# Patient Record
Sex: Female | Born: 1960 | Race: White | Hispanic: No | Marital: Single | Smoking: Current every day smoker
Health system: Southern US, Community
[De-identification: ages and names within clinical notes are randomized; demographics above are authoritative.]

## PROBLEM LIST (undated history)

## (undated) DIAGNOSIS — I10 Essential (primary) hypertension: Secondary | ICD-10-CM

## (undated) HISTORY — DX: Essential (primary) hypertension: I10

## (undated) HISTORY — PX: APPENDECTOMY: SHX54

---

## 2001-02-05 ENCOUNTER — Encounter: Admission: RE | Admit: 2001-02-05 | Discharge: 2001-02-05 | Payer: Self-pay | Admitting: Obstetrics and Gynecology

## 2001-02-05 ENCOUNTER — Encounter: Payer: Self-pay | Admitting: Obstetrics and Gynecology

## 2001-02-08 ENCOUNTER — Encounter: Admission: RE | Admit: 2001-02-08 | Discharge: 2001-02-08 | Payer: Self-pay | Admitting: Obstetrics and Gynecology

## 2001-02-08 ENCOUNTER — Encounter: Payer: Self-pay | Admitting: Obstetrics and Gynecology

## 2001-02-19 ENCOUNTER — Other Ambulatory Visit: Admission: RE | Admit: 2001-02-19 | Discharge: 2001-02-19 | Payer: Self-pay | Admitting: Obstetrics and Gynecology

## 2010-02-05 ENCOUNTER — Encounter: Admission: RE | Admit: 2010-02-05 | Discharge: 2010-02-05 | Payer: Self-pay | Admitting: Family Medicine

## 2010-08-05 ENCOUNTER — Encounter
Admission: RE | Admit: 2010-08-05 | Discharge: 2010-08-05 | Payer: Self-pay | Source: Home / Self Care | Attending: Family Medicine | Admitting: Family Medicine

## 2010-08-06 ENCOUNTER — Encounter
Admission: RE | Admit: 2010-08-06 | Discharge: 2010-08-06 | Payer: Self-pay | Source: Home / Self Care | Attending: Family Medicine | Admitting: Family Medicine

## 2010-08-18 ENCOUNTER — Encounter: Payer: Self-pay | Admitting: Family Medicine

## 2011-02-18 ENCOUNTER — Other Ambulatory Visit (INDEPENDENT_AMBULATORY_CARE_PROVIDER_SITE_OTHER): Payer: Self-pay | Admitting: General Surgery

## 2011-02-18 ENCOUNTER — Encounter (HOSPITAL_COMMUNITY): Payer: Self-pay

## 2011-02-18 ENCOUNTER — Other Ambulatory Visit: Payer: Self-pay | Admitting: Family Medicine

## 2011-02-18 ENCOUNTER — Ambulatory Visit (HOSPITAL_COMMUNITY)
Admission: RE | Admit: 2011-02-18 | Discharge: 2011-02-18 | Disposition: A | Discharge status: Home / Self Care | Payer: 59 | Source: Ambulatory Visit | Attending: Family Medicine | Admitting: Family Medicine

## 2011-02-18 ENCOUNTER — Inpatient Hospital Stay (HOSPITAL_COMMUNITY)
Admission: EM | Admit: 2011-02-18 | Discharge: 2011-02-19 | DRG: 343 | Disposition: A | Discharge status: Home / Self Care | Payer: 59 | Attending: General Surgery | Admitting: General Surgery

## 2011-02-18 DIAGNOSIS — R1031 Right lower quadrant pain: Secondary | ICD-10-CM

## 2011-02-18 DIAGNOSIS — K219 Gastro-esophageal reflux disease without esophagitis: Secondary | ICD-10-CM | POA: Diagnosis present

## 2011-02-18 DIAGNOSIS — K3580 Unspecified acute appendicitis: Secondary | ICD-10-CM

## 2011-02-18 DIAGNOSIS — I10 Essential (primary) hypertension: Secondary | ICD-10-CM | POA: Diagnosis present

## 2011-02-18 DIAGNOSIS — K389 Disease of appendix, unspecified: Secondary | ICD-10-CM | POA: Insufficient documentation

## 2011-02-18 LAB — URINE MICROSCOPIC-ADD ON

## 2011-02-18 LAB — DIFFERENTIAL
Basophils Absolute: 0 10*3/uL (ref 0.0–0.1)
Eosinophils Relative: 0 % (ref 0–5)
Lymphocytes Relative: 13 % (ref 12–46)
Lymphs Abs: 2.6 10*3/uL (ref 0.7–4.0)
Neutrophils Relative %: 77 % (ref 43–77)

## 2011-02-18 LAB — URINALYSIS, ROUTINE W REFLEX MICROSCOPIC
Bilirubin Urine: NEGATIVE
Glucose, UA: NEGATIVE mg/dL
Specific Gravity, Urine: >1.046 — ABNORMAL HIGH (ref 1.005–1.030)
Urobilinogen, UA: 0.2 mg/dL (ref 0.0–1.0)
pH: 5 (ref 5.0–8.0)

## 2011-02-18 LAB — PROTIME-INR: INR: 1.01 (ref 0.00–1.49)

## 2011-02-18 LAB — CBC
HCT: 45.6 % (ref 36.0–46.0)
Platelets: 278 10*3/uL (ref 150–400)
RBC: 4.69 MIL/uL (ref 3.87–5.11)
RDW: 12 % (ref 11.5–15.5)
WBC: 19.6 10*3/uL — ABNORMAL HIGH (ref 4.0–10.5)

## 2011-02-18 LAB — BASIC METABOLIC PANEL
Calcium: 9.5 mg/dL (ref 8.4–10.5)
Chloride: 94 mEq/L — ABNORMAL LOW (ref 96–112)
Creatinine, Ser: 0.77 mg/dL (ref 0.50–1.10)
GFR calc Af Amer: >60 mL/min (ref >60)
Sodium: 130 mEq/L — ABNORMAL LOW (ref 135–145)

## 2011-02-18 LAB — APTT: aPTT: 31 seconds (ref 24–37)

## 2011-02-18 MED ORDER — IOHEXOL 300 MG/ML  SOLN
100.0000 mL | Freq: Once | INTRAMUSCULAR | Status: AC | PRN
  Administered 2011-02-18: 100 mL via INTRAVENOUS

## 2011-02-19 DIAGNOSIS — M79609 Pain in unspecified limb: Secondary | ICD-10-CM

## 2011-02-21 ENCOUNTER — Ambulatory Visit (INDEPENDENT_AMBULATORY_CARE_PROVIDER_SITE_OTHER): Payer: 59 | Admitting: General Surgery

## 2011-02-21 ENCOUNTER — Encounter (INDEPENDENT_AMBULATORY_CARE_PROVIDER_SITE_OTHER): Payer: Self-pay | Admitting: General Surgery

## 2011-02-21 VITALS — BP 123/79 | HR 66 | Temp 97.8°F

## 2011-02-21 DIAGNOSIS — Z4889 Encounter for other specified surgical aftercare: Secondary | ICD-10-CM

## 2011-02-21 DIAGNOSIS — Z5189 Encounter for other specified aftercare: Secondary | ICD-10-CM

## 2011-02-21 MED ORDER — AMOXICILLIN-POT CLAVULANATE 875-125 MG PO TABS: 1.0000 | ORAL_TABLET | Freq: Two times a day (BID) | ORAL | Status: AC

## 2011-02-21 NOTE — Progress Notes (Signed)
Subjective:     Patient ID: [name], female   DOB: 02-05-1961, 50 y.o.   MRN: [id number]  HPI Pod #3 from lap appendectomy for acute appendicitis.  Doing well and without complaint other than some redness around her umbilical wound.  Her abdominal pain is much improved from prior to surgery and she is tolerating diet.  Bowels functional but constipated, relieved with dulcolax.  She denies fevers, chills or drainage from the wound.  Review of Systems     Objective:   Physical Exam Vitals normal. AF Abdomen is soft, with minimal incisional tenderness, no drainage, but she does have some blanching erythema inferior to the umbilicus, no fluctuance, the other incisions appear to be without infection, but she does have bruising around her other trocar sites as well.     Assessment:     S/p lap appendectomy, doing well but she does appear to have some cellulitis around the wound.    Plan:     Will try some augmentin for this. I do not see any sign of abscess.  Otherwise she is doing well. I recommended that she call us back if the redness increases, becomes more tender, fevers, or chills, or drainage.  She will follow up with me next week or sooner prn.

## 2011-02-28 ENCOUNTER — Ambulatory Visit (INDEPENDENT_AMBULATORY_CARE_PROVIDER_SITE_OTHER): Payer: 59 | Admitting: General Surgery

## 2011-02-28 ENCOUNTER — Encounter (INDEPENDENT_AMBULATORY_CARE_PROVIDER_SITE_OTHER): Payer: Self-pay | Admitting: General Surgery

## 2011-02-28 VITALS — BP 118/84 | Temp 97.8°F

## 2011-02-28 DIAGNOSIS — Z5189 Encounter for other specified aftercare: Secondary | ICD-10-CM

## 2011-02-28 DIAGNOSIS — Z4889 Encounter for other specified surgical aftercare: Secondary | ICD-10-CM

## 2011-02-28 NOTE — Progress Notes (Signed)
Subjective:     Patient ID: [name], female   DOB: 10/07/1960, 50 y.o.   MRN: [id number]  HPI She is 10 days status post arthroscopic appendectomy and follows up today for evaluation of her umbilical wound. At her last visit she had some erythema around the wound and she was started on Augmentin which she has taken now for one week. She denies any fevers or pain she states that her redness is improved. She is tolerating regular diet and functioning well without any complaints. She is asking her to go back to work.  Review of Systems     Objective:   Physical Exam She is in no acute distress and nontoxic-appearing  Her abdomen is soft and nontender to exam her incisions are well-healed without sign of infection the erythema around her umbilicus is now completely resolved. There is no evidence of bruising, infection, erythema, or tenderness.    Assessment:     S/p lap appendectomy    Plan:     She is doing very well now and the erythema has resolved completely. The pathology was benign and she has no complaints. She can return to work as tolerated although I recommended that she continue light-duty and minimize lifting for another 2 weeks. She can followup with me on a p.r.n. basis.

## 2011-03-08 NOTE — Op Note (Signed)
NAMEBRIANNA, [name]           ACCOUNT NO.:  [id number]  MEDICAL RECORD NO.:  [id number]  LOCATION:  [facility]                         FACILITY:  [facility]  PHYSICIAN:  Lodema Pilot, MD       DATE OF BIRTH:  04/01/1961  DATE OF PROCEDURE:  02/18/2011 DATE OF DISCHARGE:                              OPERATIVE REPORT   PROCEDURE PERFORMED:  Diagnostic laparoscopy with laparoscopic appendectomy.  PREOPERATIVE DIAGNOSIS:  Acute appendicitis.  POSTOPERATIVE DIAGNOSIS:  Acute appendicitis.  SURGEON:  Lodema Pilot, M.D.  ASSISTANT:  None.  ANESTHESIA:  General endotracheal anesthesia with 30 mL of 1% lidocaine with epinephrine and 0.25% Marcaine injected in a 50:50 mixture.  FLUIDS:  700 mL of crystalloid.  ESTIMATED BLOOD LOSS:  Minimal.  URINE OUTPUT:  200 mL.  DRAINS:  None.  SPECIMENS:  Appendix sent to pathology for permanent sectioning.  COMPLICATIONS:  None apparent.  FINDINGS:  Congenital abscess of the uterus, acute suppurative appendicitis with a normal-appearing base of the appendix.  No other acute intra-abdominal findings were identified.  INDICATION FOR PROCEDURE:  [name] is a 50 year old female, who began having generalized abdominal pain beginning at 9 o'clock this morning.  The pain suddenly progressed and was seen in the urgent care. She had a white count of 18,000 and was sent for CT scan of the abdomen, which demonstrated early acute appendicitis.  She had a physical exam consistent with acute appendicitis and desires surgical treatment.  OPERATIVE DETAILS:  [name] was seen and evaluated in the preoperative area and risks and benefits of the procedure were discussed in lay terms.  Informed consent was obtained.  She was taken to the operating room, placed on the table in the supine position and general endotracheal anesthesia was obtained.  Prophylactic antibiotics were given and Foley catheter was placed.  Her left arm was tucked.   Her abdomen was prepped and draped in a standard surgical fashion.  Her previous infraumbilical incision was used to access the abdomen.  The fascia was elevated between Kocher clamps and then sharply incised and the peritoneum was accessed under direct visualization. Pneumoperitoneum was obtained.  A 5 mm laparoscope scope was placed into the abdomen.  Additional left lower quadrant trocar was placed under direct visualization and the umbilical trocar was exchanged for a 12 mm balloon trocar.  A suprapubic 5 mm trocar was placed under direct visualization taking care to avoid injury to the bladder and the abdomen was explored.  There was no obvious purulence or free fluid upon initial exploration of the abdomen.  The terminal ileum was identified and the right colon and cecum were identified and immediately inferomedial to the cecum was located the appendix and she had obvious acute appendicitis with fibrinous exudate overlying injected and swollen appendix.  The appendix was easily elevated and was not too adherent to the surrounding structures.  Proximally, three-fourth of the way towards the base of the appendix was a clear transition point of acute appendicitis.  The base of the appendix was healthy-appearing.  A window was created through the mesoappendix and the appendix was divided at its base with an Endo-GIA blue load stapler using the 3.5 mm staple.  The staple  line appeared well-formed and was hemostatic.  The appendix was elevated and the mesoappendix was divided with an Endo GIA white load 2.5 mm staple firing.  The appendix was hemostatic and the staples were well-formed.  There was no evidence of bleeding.  The appendix was removed from the umbilical trocar site in an EndoCatch bag and passed off the table and send to pathology for permanent section.  The 12 mm trocar was replaced and the abdomen explored.  There was no evidence of purulent material in the appendix and  there was no evidence of bleeding. Staple lines appeared intact.  I ran the small bowel from the ileocecal valve proximally for several feet, which did not demonstrate any abnormalities such as Meckel's diverticulum.  No evidence of inflammatory bowel disease.  There was no uterus identified consistent with previous history of congenital absence of the uterus.  The right ovary was identified and pictures were taken.  The fallopian tube appeared cystic in nature.  The left ovary and tube appeared fairly normal.  The left lower quadrant and umbilical trocars were removed under direct visualization even the suprapubic trocar in place and the umbilical fascia was approximated with interrupted 0 Vicryl sutures. The sutures were secured and the abdomen re-insufflated with carbon dioxide gas and through the suprapubic trocar site, I explored the abdomen again and there was no evidence of bowel contents.  Hemostasis was adequate and no evidence of bowel injury upon closure.  The camera was removed.  The suprapubic trocar was removed.  The skin was anesthetized with a total 30 mL of 1% lidocaine with epinephrine and 0.25% Marcaine solution in a 50:50 mixture and skin was washed and dried and skin edges were approximated with 4-0 Monocryl subcuticular suture. The Dermabond was applied and all sponge and instrument count were correct at the end of the case and the patient tolerated the procedure well without any complications.  Foley catheter was removed at the end of the case and she was stabilized and transferred to the recovery room in stable condition.          ______________________________ Lodema Pilot, MD     BL/MEDQ  D:  02/18/2011  T:  02/19/2011  Job:  [id number]  Electronically Signed by Lodema Pilot DO on 03/08/2011 05:58:45 PM

## 2011-03-08 NOTE — Consult Note (Addendum)
[name], [name]           ACCOUNT NO.:  [id number]  MEDICAL RECORD NO.:  [id number]  LOCATION:                               FACILITY:  [facility]  PHYSICIAN:  Lodema Pilot, MD       DATE OF BIRTH:  1961/07/20  DATE OF CONSULTATION:  02/18/2011 DATE OF DISCHARGE:                                CONSULTATION   CONSULTING PROVIDER:  Eber Hong, MD  Consulting for abdominal pain and rule out appendicitis.  HISTORY OF PRESENT ILLNESS:  [name] is a 50-year female who has a history of occasional abdominal pain, which she describes as diffuse and intermittent.  She states that she was not feeling fine yesterday, but awoke this morning feeling fine as well but at approximately 9 o'clock this morning at work, began having diffuse abdominal pain, which she describes as "feels like someone punching me in the stomach."  It is not localized at this time, but has been continuous since this morning. She felt that this was gas pains and thought it would help if she ate some lunch.  She did eat some lunch and had increasing abdominal pain. [facility] of the abdomen, which demonstrated swelling of the tip of the appendix and concerns for early acute appendicitis and she was sent to [facility] for further evaluation.  Since her CT scan, she has had nonbloody emesis.  She denies any fevers or chills.  She also denies any hematochezia or melena.  She denies any hematuria or dysuria.  She states that her bowels are normal and last had a bowel movement this morning without any change in her symptoms.  ALLERGIES:  None.  MEDICATIONS:  Lisinopril 20 mg daily, Prilosec, Creon.  PAST HISTORY:  Hypertension, gastroesophageal reflux.  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  SOCIAL HISTORY:   She smokes 1 pack per day for the last 15 years.  She admits to being heavy drinker with 2 to 6 drinks per day, "if available."  FAMILY HISTORY:  She has a family history of an aunt with breast cancer, but otherwise denies any significant family history.  REVIEW OF SYMPTOMS:  Negative except for as in the history of present illness.  PHYSICAL EXAMINATION:  GENERAL:  She is in no acute stress and nontoxic appearing. VITAL SIGNS:  She is satting at 100% on room air.  Blood pressure is 139/92, heart rate is 116. HEENT:  Head is normocephalic and atraumatic.  Sclera is white.  Pupils are equal, round, and reactive to light. NECK:  Her neck demonstrates midline trachea.  No stridor. LUNGS:  Her lungs are clear to auscultation bilaterally without wheezes or rales. HEART:  Her heart rate is fast with heart rate in the one-teens, however, there is regular rhythm.  Pulses are palpable in all 4 extremities. ABDOMEN:  Soft.  Moderate tenderness in the right lower quadrant and suprapubic region.  She describes diffuse abdominal pain, however, tenderness seems to be focal in the right lower quadrant.  Abdomen is nondistended and no evidence of hernias. SKIN:  Warm and dry.  There is no evidence of rashes or erythema.  LABORATORY DATA:  Laboratory studies from the [facility] demonstrate a white count of 18.4, hemoglobin 16, hematocrit 49.0, platelets of 325.  She does have a left shift with 82% granulocytes. She has had a UA, which was normal except for a moderate amount of blood.  RADIOGRAPHIC STUDIES:  CT scan with findings of early acute appendicitis.  ASSESSMENT:  Abdominal pain, localized in the right lower quadrant, most likely early acute appendicitis.  RECOMMENDATIONS:  I discussed with the patient the options for overnight admission and serial abdominal exams with potential appendectomy if her symptoms worsen or do not improve versus early surgical  intervention to diagnostic laparoscopy and appendectomy.  We discussed the benefits and risks of each of these options and she would like to undergo early surgical intervention.  We discussed the risks of procedure including infection, bleeding, persistent pain, negative laparoscopy, abscess, need for future surgery, bowel injury and she expressed understanding and desires to proceed with diagnostic laparoscopy and appendectomy.  We will get an IV and administer some fluids for hydration, IV Invanz, and write for OR as soon as available.          ______________________________ Lodema Pilot, MD     BL/MEDQ  D:  02/18/2011  T:  02/18/2011  Job:  [id number]  Electronically Signed by Lodema Pilot DO on 03/18/2011 09:32:19 AM

## 2011-03-11 ENCOUNTER — Encounter (INDEPENDENT_AMBULATORY_CARE_PROVIDER_SITE_OTHER): Payer: 59

## 2011-05-07 ENCOUNTER — Other Ambulatory Visit: Payer: Self-pay | Admitting: Family Medicine

## 2011-05-07 DIAGNOSIS — Z1231 Encounter for screening mammogram for malignant neoplasm of breast: Secondary | ICD-10-CM

## 2011-09-15 ENCOUNTER — Ambulatory Visit: Payer: Self-pay | Admitting: Physician Assistant

## 2011-09-15 VITALS — BP 123/85 | HR 101 | Temp 98.2°F | Resp 16 | Ht 64.0 in | Wt 184.4 lb

## 2011-09-15 DIAGNOSIS — J019 Acute sinusitis, unspecified: Secondary | ICD-10-CM

## 2011-09-15 DIAGNOSIS — R05 Cough: Secondary | ICD-10-CM

## 2011-09-15 MED ORDER — CEFDINIR 300 MG PO CAPS: 300.0000 mg | ORAL_CAPSULE | Freq: Two times a day (BID) | ORAL | Status: AC

## 2011-09-15 MED ORDER — BENZONATATE 100 MG PO CAPS: 100.0000 mg | ORAL_CAPSULE | Freq: Three times a day (TID) | ORAL | Status: AC | PRN

## 2011-09-15 MED ORDER — IPRATROPIUM BROMIDE 0.06 % NA SOLN: 2.0000 | Freq: Two times a day (BID) | NASAL | Status: DC

## 2011-09-15 NOTE — Progress Notes (Signed)
Patient ID: [name] MRN: [id number], DOB: 02/15/1961, 51 y.o. Date of Encounter: 09/15/2011, 9:25 AM  Primary Physician: Eartha Inch, MD, MD  Chief Complaint:  Chief Complaint  Patient presents with  . Sinusitis    6 days    HPI: 51 y.o. year old female presents with 6 day history of worsening nasal congestion, sinus pressure, post nasal drip, and cough. Afebrile through out. Cough is mildly productive of green sputum, worse at night. Nasal congestion thick and green. Hearing normal. Sinus pressure worst along right maxillary sinus causing upper teeth pain and right otalgia. Normal appetite. No GI symptoms. Has tried Nyquil/Dayquil without success. Sick contacts at work. Continues to smoke, not interested in quitting.  No leg trauma, sedentary periods, h/o cancer.  Has appt with PCP next week for routine follow up.  No past medical history on file.   Home Meds: Prior to Admission medications   Medication Sig Start Date End Date Taking? Authorizing Provider  lisinopril-hydrochlorothiazide (PRINZIDE,ZESTORETIC) 20-12.5 MG per tablet Daily. 02/20/11  Yes Historical Provider, MD  Omega-3 Fatty Acids (OMEGA-3 FISH OIL PO) Take by mouth daily.     Yes Historical Provider, MD  omeprazole (PRILOSEC) 20 MG capsule Daily. 01/18/11  Yes Historical Provider, MD  Loratadine (CLARITIN PO) Take by mouth daily.      Historical Provider, MD    Allergies: No Known Allergies  History   Social History  . Marital Status: Single    Spouse Name: N/A    Number of Children: N/A  . Years of Education: N/A   Occupational History  . Not on file.   Social History Main Topics  . Smoking status: Current Everyday Smoker -- 1.0 packs/day    Types: Cigarettes  . Smokeless tobacco: Current User  . Alcohol Use: Yes  . Drug Use: No  . Sexually Active:    Other Topics Concern  . Not on file   Social History Narrative  . No narrative on file     Review of  Systems: Constitutional: negative for chills, fever, night sweats or weight changes Cardiovascular: negative for chest pain or palpitations Respiratory: negative for hemoptysis, wheezing, or shortness of breath Abdominal: negative for abdominal pain, nausea, vomiting or diarrhea Dermatological: negative for rash Neurologic: negative for headache   Physical Exam: Blood pressure 123/85, pulse 101, temperature 98.2 F (36.8 C), temperature source Oral, resp. rate 16, height 5\' 4"  (1.626 m), weight 184 lb 6.4 oz (83.643 kg), SpO2 97.00%., Body mass index is 31.65 kg/(m^2). General: Well developed, well nourished, in no acute distress. Head: Normocephalic, atraumatic, eyes without discharge, sclera non-icteric, nares are congested. Bilateral auditory canals clear, TM's are without perforation, left TM pearly grey with reflective cone of light. Right TM erythematous and dull. Right maxillary sinus TTP.  Oral cavity moist, dentition normal without TTP. Posterior pharynx with post nasal drip and mild erythema. No peritonsillar abscess or tonsillar exudate. Neck: Supple. No thyromegaly. Full ROM. No lymphadenopathy. Lungs: Clear bilaterally to auscultation without wheezes, rales, or rhonchi. Breathing is unlabored. Heart: RRR with S1 S2. No murmurs, rubs, or gallops appreciated. Msk:  Strength and tone normal for age. Extremities: No clubbing or cyanosis. No edema. Neuro: Alert and oriented X 3. Moves all extremities spontaneously. CNII-XII grossly in tact. Psych:  Responds to questions appropriately with a normal affect.     ASSESSMENT AND PLAN:  51 y.o. year old female with sinusitis. -Omnicef 300 mg 1 po bid #20 no RF  -Atrovent NS 0.06%  2 sprays each nare bid prn #1 no RF -Tessalon Perles 100 mg 1 po tid prn cough #30 no RF  -Mucinex -Tylenol/Motrin prn -Rest/fluids -RTC precautions -RTC 3-5 days if no improvement  Signed, Eula Listen, PA-C 09/15/2011 9:25 AM

## 2011-09-15 NOTE — Patient Instructions (Signed)

## 2015-03-22 DIAGNOSIS — J309 Allergic rhinitis, unspecified: Secondary | ICD-10-CM | POA: Insufficient documentation

## 2016-01-11 ENCOUNTER — Ambulatory Visit (INDEPENDENT_AMBULATORY_CARE_PROVIDER_SITE_OTHER): Payer: Self-pay | Admitting: Urgent Care

## 2016-01-11 VITALS — BP 122/74 | HR 116 | Temp 98.3°F | Resp 19 | Ht 63.25 in | Wt 195.0 lb

## 2016-01-11 DIAGNOSIS — S61219A Laceration without foreign body of unspecified finger without damage to nail, initial encounter: Secondary | ICD-10-CM

## 2016-01-11 DIAGNOSIS — S61213A Laceration without foreign body of left middle finger without damage to nail, initial encounter: Secondary | ICD-10-CM

## 2016-01-11 NOTE — Patient Instructions (Addendum)
WOUND CARE Please return in 10 days to have your stitches/staples removed or sooner if you have concerns. . Keep area clean and dry for 24 hours. Do not remove bandage, if applied. . After 24 hours, remove bandage and wash wound gently with mild soap and warm water. Reapply a new bandage after cleaning wound, if directed. . Continue daily cleansing with soap and water until stitches/staples are removed. . Do not apply any ointments or creams to the wound while stitches/staples are in place, as this may cause delayed healing. . Notify the office if you experience any of the following signs of infection: Swelling, redness, pus drainage, streaking, fever >101.0 F . Notify the office if you experience excessive bleeding that does not stop after 15-20 minutes of constant, firm pressure.      IF you received an x-ray today, you will receive an invoice from [facility]. Please contact [facility] at [phone] with questions or concerns regarding your invoice.   IF you received labwork today, you will receive an invoice from [facility]. Please contact [facility] at [phone] with questions or concerns regarding your invoice.   Our billing staff will not be able to assist you with questions regarding bills from these companies.  You will be contacted with the lab results as soon as they are available. The fastest way to get your results is to activate your My Chart account. Instructions are located on the last page of this paperwork. If you have not heard from us regarding the results in 2 weeks, please contact this office.      

## 2016-01-11 NOTE — Progress Notes (Signed)
    MRN: [id number] DOB: 03-01-1961  Subjective:   [name] is a 55 y.o. female presenting for chief complaint of Laceration  Reports suffering a laceration while working with a needle today. Patient states that the machine she was using lost control of the needle and she pulled her hand back abruptly causing a laceration of her left middle finger. She bled profusely, wrapped it and came straight to our clinic. She is self-pay declines TDAP, x-rays.  Haly has a current medication list which includes the following prescription(s): lisinopril-hydrochlorothiazide, loratadine, and omeprazole. Also has No Known Allergies.  [name]  has a past medical history of Hypertension and Allergic rhinitis. Also  has past surgical history that includes Appendectomy.  Objective:   Vitals: BP 122/74 mmHg  Pulse 116  Temp(Src) 98.3 F (36.8 C) (Oral)  Resp 19  Ht 5' 3.25" (1.607 m)  Wt 195 lb (88.451 kg)  BMI 34.25 kg/m2  SpO2 95%  Physical Exam  Constitutional: She is oriented to person, place, and time. She appears well-developed and well-nourished.  Cardiovascular: Normal rate.   Pulmonary/Chest: Effort normal.  Musculoskeletal:       Left hand: She exhibits tenderness (over laceration) and laceration. She exhibits normal range of motion, normal capillary refill, no deformity and no swelling. Normal sensation noted. Normal strength noted.       Hands: Neurological: She is alert and oriented to person, place, and time.  Skin: Skin is warm and dry.   Assessment and Plan :   1. Laceration of finger, initial encounter - Wound care reviewed, rtc in 10 days for suture removal.  Jaynee Eagles, PA-C [facility] [phone] 01/11/2016 6:22 PM

## 2016-01-11 NOTE — Progress Notes (Signed)
Procedure: Consent obtained.  MC block with 2% lidocaine.  Anesthesia obtained.  Wound cleaned.  Sterile drape placed.  Penrose drain for hemostasis.  Rough wound edges debrided.  Wound closed with 5-0 Ethilon #6 SI and #1 corner stitch.  Wound care d/w pt.  Drsg placed.

## 2016-01-21 ENCOUNTER — Ambulatory Visit (INDEPENDENT_AMBULATORY_CARE_PROVIDER_SITE_OTHER): Payer: Self-pay | Admitting: Physician Assistant

## 2016-01-21 VITALS — BP 108/76 | HR 106 | Temp 98.4°F | Resp 18

## 2016-01-21 DIAGNOSIS — S61219D Laceration without foreign body of unspecified finger without damage to nail, subsequent encounter: Secondary | ICD-10-CM

## 2016-01-21 DIAGNOSIS — Z4802 Encounter for removal of sutures: Secondary | ICD-10-CM

## 2016-01-21 NOTE — Progress Notes (Signed)
Chief Complaint  Patient presents with  . Suture / Staple Removal    History of Present Illness: Patient presents for suture removal.  She presented for wound repair on 01/11/2016 after an embroidery needle wounded the LEFT middle finger.  The wound was closed with #7 5-0 Ethilon sutures. The finger remains very sensitive to touch, but is improving. No swelling. No redness. No drainage. No fever/chills.   No Known Allergies  Prior to Admission medications   Medication Sig Start Date End Date Taking? Authorizing Provider  lisinopril-hydrochlorothiazide (PRINZIDE,ZESTORETIC) 20-12.5 MG per tablet Daily. 02/20/11   Historical Provider, MD  Loratadine (CLARITIN PO) Take by mouth daily.      Historical Provider, MD  omeprazole (PRILOSEC) 20 MG capsule Daily. 01/18/11   Historical Provider, MD    Patient Active Problem List   Diagnosis Date Noted  . Allergic rhinitis      Physical Exam  Constitutional: She is oriented to person, place, and time. She appears well-developed and well-nourished. She is active and cooperative. No distress.  BP 108/76 mmHg  Pulse 106  Temp(Src) 98.4 F (36.9 C)  Resp 18  SpO2 96%   Eyes: Conjunctivae are normal.  Pulmonary/Chest: Effort normal.  Neurological: She is alert and oriented to person, place, and time.  Skin: Skin is warm and dry. Laceration (healed, with #7 sutures intact. the wound along the nail fold is crusted and scabbed. No erythema, edema, streaking. No drainage expressed. ) noted.  #7 sutures removed without complication. Bandaid applied.  Psychiatric: She has a normal mood and affect. Her speech is normal and behavior is normal.      ASSESSMENT & PLAN:  1. Laceration of finger, subsequent encounter 2. Visit for suture removal Local wound care. Anticipatory guidance. RTC prn.   Fara Chute, PA-C Physician Assistant-Certified [facility]

## 2016-01-21 NOTE — Patient Instructions (Signed)
     IF you received an x-ray today, you will receive an invoice from [facility]. Please contact [facility] at [phone] with questions or concerns regarding your invoice.   IF you received labwork today, you will receive an invoice from [facility]. Please contact [facility] at [phone] with questions or concerns regarding your invoice.   Our billing staff will not be able to assist you with questions regarding bills from these companies.  You will be contacted with the lab results as soon as they are available. The fastest way to get your results is to activate your My Chart account. Instructions are located on the last page of this paperwork. If you have not heard from us regarding the results in 2 weeks, please contact this office.      

## 2016-02-21 DIAGNOSIS — R7989 Other specified abnormal findings of blood chemistry: Secondary | ICD-10-CM | POA: Insufficient documentation

## 2016-02-21 HISTORY — DX: Other specified abnormal findings of blood chemistry: R79.89

## 2018-04-08 ENCOUNTER — Other Ambulatory Visit: Payer: Self-pay

## 2018-04-08 ENCOUNTER — Encounter (HOSPITAL_COMMUNITY): Payer: Self-pay | Admitting: *Deleted

## 2018-04-08 DIAGNOSIS — Z79899 Other long term (current) drug therapy: Secondary | ICD-10-CM | POA: Insufficient documentation

## 2018-04-08 DIAGNOSIS — K529 Noninfective gastroenteritis and colitis, unspecified: Secondary | ICD-10-CM | POA: Insufficient documentation

## 2018-04-08 DIAGNOSIS — F17228 Nicotine dependence, chewing tobacco, with other nicotine-induced disorders: Secondary | ICD-10-CM | POA: Insufficient documentation

## 2018-04-08 DIAGNOSIS — I10 Essential (primary) hypertension: Secondary | ICD-10-CM | POA: Insufficient documentation

## 2018-04-08 DIAGNOSIS — R1110 Vomiting, unspecified: Secondary | ICD-10-CM | POA: Insufficient documentation

## 2018-04-08 DIAGNOSIS — F17210 Nicotine dependence, cigarettes, uncomplicated: Secondary | ICD-10-CM | POA: Insufficient documentation

## 2018-04-08 NOTE — ED Triage Notes (Signed)
Pt reports abdominal swelling, generalized pain and vomiting today. No diarrhea, fevers, back pain,  or urinary symptoms.

## 2018-04-09 ENCOUNTER — Emergency Department (HOSPITAL_COMMUNITY): Payer: Self-pay

## 2018-04-09 ENCOUNTER — Emergency Department (HOSPITAL_COMMUNITY)
Admission: EM | Admit: 2018-04-09 | Discharge: 2018-04-09 | Disposition: A | Discharge status: Home / Self Care | Payer: Self-pay | Attending: Emergency Medicine | Admitting: Emergency Medicine

## 2018-04-09 DIAGNOSIS — K529 Noninfective gastroenteritis and colitis, unspecified: Secondary | ICD-10-CM

## 2018-04-09 LAB — COMPREHENSIVE METABOLIC PANEL
ALBUMIN: 4.1 g/dL (ref 3.5–5.0)
ALK PHOS: 103 U/L (ref 38–126)
ALT: 44 U/L (ref 0–44)
AST: 40 U/L (ref 15–41)
Anion gap: 13 (ref 5–15)
BILIRUBIN TOTAL: 1.1 mg/dL (ref 0.3–1.2)
BUN: 25 mg/dL — AB (ref 6–20)
CALCIUM: 9.6 mg/dL (ref 8.9–10.3)
CO2: 23 mmol/L (ref 22–32)
Chloride: 102 mmol/L (ref 98–111)
Creatinine, Ser: 1.07 mg/dL — ABNORMAL HIGH (ref 0.44–1.00)
GFR calc Af Amer: >60 mL/min (ref >60)
GFR calc non Af Amer: 57 mL/min — ABNORMAL LOW (ref >60)
GLUCOSE: 200 mg/dL — AB (ref 70–99)
Potassium: 4 mmol/L (ref 3.5–5.1)
SODIUM: 138 mmol/L (ref 135–145)
TOTAL PROTEIN: 8.7 g/dL — AB (ref 6.5–8.1)

## 2018-04-09 LAB — URINALYSIS, ROUTINE W REFLEX MICROSCOPIC
Bilirubin Urine: NEGATIVE
Glucose, UA: NEGATIVE mg/dL
KETONES UR: 20 mg/dL — AB
Leukocytes, UA: NEGATIVE
Nitrite: NEGATIVE
PH: 5 (ref 5.0–8.0)
PROTEIN: 30 mg/dL — AB
Specific Gravity, Urine: 1.03 (ref 1.005–1.030)

## 2018-04-09 LAB — CBC
HCT: 53.3 % — ABNORMAL HIGH (ref 36.0–46.0)
HEMOGLOBIN: 18.2 g/dL — AB (ref 12.0–15.0)
MCH: 33.8 pg (ref 26.0–34.0)
MCHC: 34.1 g/dL (ref 30.0–36.0)
MCV: 98.9 fL (ref 78.0–100.0)
Platelets: 327 10*3/uL (ref 150–400)
RBC: 5.39 MIL/uL — ABNORMAL HIGH (ref 3.87–5.11)
RDW: 12.7 % (ref 11.5–15.5)
WBC: 17 10*3/uL — ABNORMAL HIGH (ref 4.0–10.5)

## 2018-04-09 LAB — LIPASE, BLOOD: Lipase: 42 U/L (ref 11–51)

## 2018-04-09 MED ORDER — ONDANSETRON 8 MG PO TBDP: 8.0000 mg | ORAL_TABLET | Freq: Three times a day (TID) | ORAL | 0 refills | Status: DC | PRN

## 2018-04-09 MED ORDER — ONDANSETRON HCL 4 MG/2ML IJ SOLN
4.0000 mg | Freq: Once | INTRAMUSCULAR | Status: AC
  Administered 2018-04-09: 4 mg via INTRAVENOUS
  Filled 2018-04-09: qty 2

## 2018-04-09 MED ORDER — SODIUM CHLORIDE 0.9 % IV BOLUS
1000.0000 mL | Freq: Once | INTRAVENOUS | Status: AC
  Administered 2018-04-09: 1000 mL via INTRAVENOUS

## 2018-04-09 NOTE — ED Provider Notes (Signed)
[place] DEPT Provider Note: Georgena Spurling, MD, FACEP  CSN: [id number] MRN: [id number] ARRIVAL: 04/08/18 at 2259 ROOM: [place]  Abdominal Pain   HISTORY OF PRESENT ILLNESS  04/09/18 1:15 AM [name] is a 57 y.o. female with abdominal pain that began about 3 PM yesterday afternoon.  She describes the pain as occurring in waves.  She rates her pain as a 10 out of 10 at its worst but mild presently.  Nothing seems to bring the pain on.  There is some tenderness of her abdomen.  She is unable to characterize the pain otherwise.  She has had 3 episodes of vomiting with this.  Her abdomen is also distended.  She had a normal bowel movement yesterday she has not had a fever.   Past Medical History:  Diagnosis Date  . Allergic rhinitis   . Hypertension     Past Surgical History:  Procedure Laterality Date  . APPENDECTOMY      No family history on file.  Social History   Tobacco Use  . Smoking status: Current Every Day Smoker    Packs/day: 1.00    Types: Cigarettes  . Smokeless tobacco: Current User  Substance Use Topics  . Alcohol use: Yes  . Drug use: No    Prior to Admission medications   Medication Sig Start Date End Date Taking? Authorizing Provider  lisinopril-hydrochlorothiazide (PRINZIDE,ZESTORETIC) 20-25 MG tablet Take 1 tablet by mouth daily. 02/10/18  Yes [provider]  Loratadine (CLARITIN PO) Take 10 mg by mouth daily.    Yes [provider]  omeprazole (PRILOSEC) 20 MG capsule Take 20 mg by mouth Daily.  01/18/11  Yes [provider]    Allergies Patient has no known allergies.   REVIEW OF SYSTEMS  Negative except as noted here or in the History of Present Illness.   PHYSICAL EXAMINATION  Initial Vital Signs Blood pressure 104/78, pulse (!) 115, temperature 97.8 F (36.6 C), temperature source Oral, resp. rate 20, SpO2 99 %.  Examination General: Well-developed, well-nourished female  in no acute distress; appearance consistent with age of record HENT: normocephalic; atraumatic Eyes: pupils equal, round and reactive to light; extraocular muscles intact Neck: supple Heart: regular rate and rhythm Lungs: clear to auscultation bilaterally Abdomen: soft; distended; mild mid abdominal tenderness; bowel sounds present Extremities: No deformity; full range of motion; pulses normal Neurologic: Awake, alert and oriented; motor function intact in all extremities and symmetric; no facial droop Skin: Warm and dry Psychiatric: Normal mood and affect   RESULTS  Summary of this visit's results, reviewed by myself:   EKG Interpretation  Date/Time:    Ventricular Rate:    PR Interval:    QRS Duration:   QT Interval:    QTC Calculation:   R Axis:     Text Interpretation:        Laboratory Studies: Results for orders placed or performed during the hospital encounter of 04/09/18 (from the past 24 hour(s))  Lipase, blood     Status: None   Collection Time: 04/09/18 12:05 AM  Result Value Ref Range   Lipase 42 11 - 51 U/L  Comprehensive metabolic panel     Status: Abnormal   Collection Time: 04/09/18 12:05 AM  Result Value Ref Range   Sodium 138 135 - 145 mmol/L   Potassium 4.0 3.5 - 5.1 mmol/L   Chloride 102 98 - 111 mmol/L   CO2 23 22 - 32 mmol/L   Glucose,  Bld 200 (H) 70 - 99 mg/dL   BUN 25 (H) 6 - 20 mg/dL   Creatinine, Ser 1.07 (H) 0.44 - 1.00 mg/dL   Calcium 9.6 8.9 - 10.3 mg/dL   Total Protein 8.7 (H) 6.5 - 8.1 g/dL   Albumin 4.1 3.5 - 5.0 g/dL   AST 40 15 - 41 U/L   ALT 44 0 - 44 U/L   Alkaline Phosphatase 103 38 - 126 U/L   Total Bilirubin 1.1 0.3 - 1.2 mg/dL   GFR calc non Af Amer 57 (L) >60 mL/min   GFR calc Af Amer >60 >60 mL/min   Anion gap 13 5 - 15  CBC     Status: Abnormal   Collection Time: 04/09/18 12:05 AM  Result Value Ref Range   WBC 17.0 (H) 4.0 - 10.5 K/uL   RBC 5.39 (H) 3.87 - 5.11 MIL/uL   Hemoglobin 18.2 (H) 12.0 - 15.0 g/dL   HCT  53.3 (H) 36.0 - 46.0 %   MCV 98.9 78.0 - 100.0 fL   MCH 33.8 26.0 - 34.0 pg   MCHC 34.1 30.0 - 36.0 g/dL   RDW 12.7 11.5 - 15.5 %   Platelets 327 150 - 400 K/uL  Urinalysis, Routine w reflex microscopic     Status: Abnormal   Collection Time: 04/09/18  1:35 AM  Result Value Ref Range   Color, Urine AMBER (A) YELLOW   APPearance HAZY (A) CLEAR   Specific Gravity, Urine 1.030 1.005 - 1.030   pH 5.0 5.0 - 8.0   Glucose, UA NEGATIVE NEGATIVE mg/dL   Hgb urine dipstick MODERATE (A) NEGATIVE   Bilirubin Urine NEGATIVE NEGATIVE   Ketones, ur 20 (A) NEGATIVE mg/dL   Protein, ur 30 (A) NEGATIVE mg/dL   Nitrite NEGATIVE NEGATIVE   Leukocytes, UA NEGATIVE NEGATIVE   RBC / HPF 0-5 0 - 5 RBC/hpf   WBC, UA 0-5 0 - 5 WBC/hpf   Bacteria, UA RARE (A) NONE SEEN   Squamous Epithelial / LPF 0-5 0 - 5   Mucus PRESENT    Hyaline Casts, UA PRESENT    Imaging Studies: Dg Abd Acute W/chest  Result Date: 04/09/2018 CLINICAL DATA:  Mid abdominal pain. Abdominal distention and nausea. EXAM: DG ABDOMEN ACUTE W/ 1V CHEST COMPARISON:  CT 02/18/2011 FINDINGS: The cardiomediastinal contours are normal. Linear opacity at the left lung base favors atelectasis. There is no free intra-abdominal air. No dilated bowel loops to suggest obstruction. Featureless loop of bowel in the right mid abdomen. Small volume of stool throughout the colon. No radiopaque calculi. No acute osseous abnormalities are seen. IMPRESSION: 1. No bowel obstruction or free air. Featureless loop of small bowel in the right mid abdomen suggest enteritis but is nonspecific. 2. Left lung base atelectasis. Electronically Signed   By: Keith Rake M.D.   On: 04/09/2018 02:32    ED COURSE and MDM  Nursing notes and initial vitals signs, including pulse oximetry, reviewed.  Vitals:   04/08/18 2330  BP: 104/78  Pulse: (!) 115  Resp: 20  Temp: 97.8 F (36.6 C)  TempSrc: Oral  SpO2: 99%   2:56 AM Patient has had no further pain episodes or  emesis since my evaluation.  History and radiographs consistent with a gastroenteritis.  She was advised to return if symptoms worsen or change.   PROCEDURES    ED DIAGNOSES     ICD-10-CM   1. Gastroenteritis K52.9        Renaldo Gornick, MD  04/09/18 0257  

## 2019-11-07 LAB — BASIC METABOLIC PANEL
BUN: 16 (ref 4–21)
CO2: 20 (ref 13–22)
Chloride: 102 (ref 99–108)
Creatinine: 16 — AB (ref 0.5–1.1)
Glucose: 110
Potassium: 4 (ref 3.4–5.3)
Sodium: 136 — AB (ref 137–147)

## 2019-11-07 LAB — HEPATIC FUNCTION PANEL
ALT: 38 — AB (ref 7–35)
Alkaline Phosphatase: 101 (ref 25–125)
Bilirubin, Total: 0.5

## 2019-11-07 LAB — COMPREHENSIVE METABOLIC PANEL
Albumin: 3.8 (ref 3.5–5.0)
Calcium: 9.5 (ref 8.7–10.7)
GFR calc Af Amer: 95
GFR calc non Af Amer: 82

## 2019-11-07 LAB — CBC AND DIFFERENTIAL
HCT: 48 — AB (ref 36–46)
Hemoglobin: 16.9 — AB (ref 12.0–16.0)
Neutrophils Absolute: 5928
Platelets: 263 (ref 150–399)
WBC: 10.4

## 2019-11-07 LAB — CBC: RBC: 5 (ref 3.87–5.11)

## 2019-11-07 LAB — TSH: TSH: 2.61 (ref 0.41–5.90)

## 2019-11-07 LAB — HEMOGLOBIN A1C: Hemoglobin A1C: 5.9

## 2019-11-08 ENCOUNTER — Encounter: Payer: Self-pay | Admitting: Physician Assistant

## 2019-11-08 LAB — LIPID PANEL
Cholesterol: 210 — AB (ref 0–200)
HDL: 48 (ref 35–70)
LDL Cholesterol: 148

## 2019-11-22 ENCOUNTER — Ambulatory Visit: Payer: Self-pay | Admitting: Physician Assistant

## 2019-11-22 ENCOUNTER — Encounter: Payer: Self-pay | Admitting: Physician Assistant

## 2019-11-22 ENCOUNTER — Other Ambulatory Visit: Payer: Self-pay

## 2019-11-22 VITALS — BP 118/68 | HR 106 | Ht 63.75 in | Wt 207.0 lb

## 2019-11-22 DIAGNOSIS — Z1211 Encounter for screening for malignant neoplasm of colon: Secondary | ICD-10-CM

## 2019-11-22 DIAGNOSIS — R1084 Generalized abdominal pain: Secondary | ICD-10-CM

## 2019-11-22 DIAGNOSIS — K219 Gastro-esophageal reflux disease without esophagitis: Secondary | ICD-10-CM | POA: Insufficient documentation

## 2019-11-22 DIAGNOSIS — Z01818 Encounter for other preprocedural examination: Secondary | ICD-10-CM

## 2019-11-22 DIAGNOSIS — R112 Nausea with vomiting, unspecified: Secondary | ICD-10-CM

## 2019-11-22 MED ORDER — NA SULFATE-K SULFATE-MG SULF 17.5-3.13-1.6 GM/177ML PO SOLN: 1.0000 | Freq: Once | ORAL | 0 refills | Status: AC

## 2019-11-22 NOTE — Progress Notes (Signed)
Reviewed and agree with management plans. ? ?Corvin Sorbo L. Poonam Woehrle, MD, MPH  ?

## 2019-11-22 NOTE — Progress Notes (Signed)
Subjective:    Patient ID: [name], female    DOB: 03-18-1961, 59 y.o.   MRN: [id number]  HPI [name] is a pleasant 59 year old white female, new to GI today, self-referred for evaluation of intermittent episodes of abdominal pain with vomiting.  Patient has not had any prior GI evaluation. She does have history of GERD which she says is well controlled with omeprazole 20 mg p.o. daily.  No complaints of dysphagia or odynophagia.  Also with hypertension. She is status post remote diagnostic laparoscopy in the 1980s, and underwent laparoscopic appendectomy in 2012. She says she has been having intermittent episodes of what she describes as severe abdominal pain over the past 1-1/2 years.  She had initial episode in 2019.  She did have ER evaluation at that time but did not have imaging other than plain films which showed a featureless loop of bowel in the right mid  abdomen which was read as suggestive of enteritis. She says she had another similar episode in June 2020 in August 2020, then February 2020 and again about 3 weeks ago.  She says this last episode was the worst.  Generally the episodes start with mid abdominal pain which becomes generalized abdominal pain without distention.  She describes it as hard cramps or like labor eventually culminating in several episodes of vomiting which then relieves the pain.  These episodes typically start in the late afternoon and may last as long as 8 to 10 hours.  She does not have fever or chills, no diarrhea melena or hematochezia.  She has no issues with constipation and says her bowel habits are normal before the episodes and return to normal fairly immediately after the episodes.  She will stay sore in her abdomen for a day or 2 after an episode and then feels fine other than complaints of frequent bloating which she says she has had long-term.  Family history is pertinent for history of polyps, no colon cancers.  She has a brother and a niece  with celiac disease. She reports recent labs done through her PCP Dr. Tamsen Castaneda a few weeks ago.  Review of Systems Pertinent positive and negative review of systems were noted in the above HPI section.  All other review of systems was otherwise negative.  Outpatient Encounter Medications as of 11/22/2019  Medication Sig  . lisinopril-hydrochlorothiazide (PRINZIDE,ZESTORETIC) 20-25 MG tablet Take 1 tablet by mouth daily.  . Loratadine (CLARITIN PO) Take 10 mg by mouth daily.   Marland Kitchen omeprazole (PRILOSEC) 20 MG capsule Take 20 mg by mouth Daily.   . [DISCONTINUED] ondansetron (ZOFRAN ODT) 8 MG disintegrating tablet Take 1 tablet (8 mg total) by mouth every 8 (eight) hours as needed for nausea or vomiting.   No facility-administered encounter medications on file as of 11/22/2019.   No Known Allergies Patient Active Problem List   Diagnosis Date Noted  . GERD (gastroesophageal reflux disease) 11/22/2019  . Allergic rhinitis    Social History   Socioeconomic History  . Marital status: Single    Spouse name: Not on file  . Number of children: Not on file  . Years of education: Not on file  . Highest education level: Not on file  Occupational History  . Not on file  Tobacco Use  . Smoking status: Current Every Day Smoker    Packs/day: 1.00    Types: Cigarettes  . Smokeless tobacco: Current User  Substance and Sexual Activity  . Alcohol use: Yes  . Drug use:  No  . Sexual activity: Not on file  Other Topics Concern  . Not on file  Social History Narrative  . Not on file   Social Determinants of Health   Financial Resource Strain:   . Difficulty of Paying Living Expenses:   Food Insecurity:   . Worried About Charity fundraiser in the Last Year:   . Arboriculturist in the Last Year:   Transportation Needs:   . Film/video editor (Medical):   Marland Kitchen Lack of Transportation (Non-Medical):   Physical Activity:   . Days of Exercise per Week:   . Minutes of Exercise per Session:    Stress:   . Feeling of Stress :   Social Connections:   . Frequency of Communication with Friends and Family:   . Frequency of Social Gatherings with Friends and Family:   . Attends Religious Services:   . Active Member of Clubs or Organizations:   . Attends Archivist Meetings:   Marland Kitchen Marital Status:   Intimate Partner Violence:   . Fear of Current or Ex-Partner:   . Emotionally Abused:   Marland Kitchen Physically Abused:   . Sexually Abused:     [name] family history includes Colon polyps in her mother.      Objective:    Vitals:   11/22/19 1425  BP: 118/68  Pulse: (!) 106    Physical Exam Well-developed well-nourished obese white female  in no acute distress.  Pleasant height, Weight, 207 BMI 35.8  HEENT; nontraumatic normocephalic, EOMI, PER R LA, sclera anicteric. Oropharynx; not examined Neck; supple, no JVD Cardiovascular; regular rate and rhythm with S1-S2, no murmur rub or gallop Pulmonary; Clear bilaterally Abdomen; soft, obese, nontender, nondistended, no palpable mass or hepatosplenomegaly, bowel sounds are active Rectal; not done today Skin; benign exam, no jaundice rash or appreciable lesions Extremities; no clubbing cyanosis or edema skin warm and dry Neuro/Psych; alert and oriented x4, grossly nonfocal mood and affect appropriate       Assessment & Plan:   #39 59 year old white female with 1 and half year history of intermittent episodes of severe abdominal pain, lasting 8 to 10 hours, crampy in nature and culminating in several episodes of vomiting which relieves symptoms. Last episode about 3 weeks ago. I suspect she is having intermittent partial bowel obstructions.  Cannot rule out biliary colic though less likely.  #2 status post laparoscopic appendectomy 2012 and remote diagnostic laparoscopy 1980s  #3 colon cancer screening-no prior colonoscopy, family history of polyps  #4 obesity #5.  GERD # 6.  Hypertension  Plan; Patient is  signed a release and will obtain copies of her most recent labs from her PCP done a few weeks ago. Patient will be scheduled for colonoscopy with Dr. Tarri Castaneda.  Procedure was discussed in detail with the patient including indications risks and benefits and she is agreeable to proceed.  I think will be most helpful to obtain CT of the abdomen and pelvis within 24 hours of next episode of pain/ nausea and vomiting.  Therefore I have asked her to call the office immediately after her next episode and we will schedule her for urgent CT of the abdomen and pelvis with contrast. She was advised that should she have an episode that does not self resolve within 24 hours that she should go to the emergency room for evaluation.    Amy S Esterwood PA-C 11/22/2019   Cc: Brittney Roers, MD

## 2019-11-22 NOTE — Patient Instructions (Signed)
If you are age 57 or older, your body mass index should be between 23-30. Your Body mass index is 35.81 kg/m. If this is out of the aforementioned range listed, please consider follow up with your Primary Care Provider.  If you are age 27 or younger, your body mass index should be between 19-25. Your Body mass index is 35.81 kg/m. If this is out of the aformentioned range listed, please consider follow up with your Primary Care Provider.   You have been scheduled for a colonoscopy. Please follow written instructions given to you at your visit today.  Please pick up your prep supplies at the pharmacy within the next 1-3 days. If you use inhalers (even only as needed), please bring them with you on the day of your procedure.  Call and ask for Amy Esterwood's nurse, Beth to get scheduled for a CT scan after your next episode of pain.  Any follow ups will be pending results of procedures.

## 2019-11-23 ENCOUNTER — Other Ambulatory Visit: Payer: Self-pay | Admitting: Internal Medicine

## 2019-12-01 ENCOUNTER — Other Ambulatory Visit: Payer: Self-pay

## 2020-01-03 ENCOUNTER — Other Ambulatory Visit: Payer: Self-pay | Admitting: Nurse Practitioner

## 2020-01-03 ENCOUNTER — Ambulatory Visit (INDEPENDENT_AMBULATORY_CARE_PROVIDER_SITE_OTHER): Payer: 59

## 2020-01-03 ENCOUNTER — Other Ambulatory Visit: Payer: Self-pay

## 2020-01-03 ENCOUNTER — Ambulatory Visit: Payer: 59 | Admitting: Nurse Practitioner

## 2020-01-03 ENCOUNTER — Other Ambulatory Visit: Payer: Self-pay | Admitting: Gastroenterology

## 2020-01-03 ENCOUNTER — Encounter: Payer: Self-pay | Admitting: Nurse Practitioner

## 2020-01-03 VITALS — BP 110/72 | HR 97 | Temp 97.5°F | Ht 64.0 in | Wt 206.6 lb

## 2020-01-03 DIAGNOSIS — E785 Hyperlipidemia, unspecified: Secondary | ICD-10-CM | POA: Diagnosis not present

## 2020-01-03 DIAGNOSIS — K219 Gastro-esophageal reflux disease without esophagitis: Secondary | ICD-10-CM | POA: Diagnosis not present

## 2020-01-03 DIAGNOSIS — Z1231 Encounter for screening mammogram for malignant neoplasm of breast: Secondary | ICD-10-CM

## 2020-01-03 DIAGNOSIS — Z01818 Encounter for other preprocedural examination: Secondary | ICD-10-CM | POA: Diagnosis not present

## 2020-01-03 DIAGNOSIS — I10 Essential (primary) hypertension: Secondary | ICD-10-CM

## 2020-01-03 DIAGNOSIS — Z720 Tobacco use: Secondary | ICD-10-CM | POA: Insufficient documentation

## 2020-01-03 DIAGNOSIS — Z1159 Encounter for screening for other viral diseases: Secondary | ICD-10-CM

## 2020-01-03 LAB — SARS CORONAVIRUS 2 (TAT 6-24 HRS): SARS Coronavirus 2: NEGATIVE

## 2020-01-03 MED ORDER — LANSOPRAZOLE 30 MG PO CPDR: 30.0000 mg | DELAYED_RELEASE_CAPSULE | Freq: Every day | ORAL | 1 refills | Status: DC

## 2020-01-03 MED ORDER — LISINOPRIL-HYDROCHLOROTHIAZIDE 20-25 MG PO TABS: 1.0000 | ORAL_TABLET | Freq: Every day | ORAL | 1 refills | Status: DC

## 2020-01-03 MED ORDER — ROSUVASTATIN CALCIUM 5 MG PO TABS: ORAL_TABLET | ORAL | 5 refills | Status: DC

## 2020-01-03 NOTE — Patient Instructions (Addendum)
It is important for you to maintain DASH diet and quit tobacco use, in order to avoid complications like heart disease, peripheral artery disease, renal disease, diabetes.  Normal ECG Start crestor 5mg  3x/week (mon, tues, and sat)  Ok to proceed with colonoscopy.  DASH Eating Plan DASH stands for "Dietary Approaches to Stop Hypertension." The DASH eating plan is a healthy eating plan that has been shown to reduce high blood pressure (hypertension). It may also reduce your risk for type 2 diabetes, heart disease, and stroke. The DASH eating plan may also help with weight loss. What are tips for following this plan?  General guidelines  Avoid eating more than 2,300 mg (milligrams) of salt (sodium) a day. If you have hypertension, you may need to reduce your sodium intake to 1,500 mg a day.  Limit alcohol intake to no more than 1 drink a day for nonpregnant women and 2 drinks a day for men. One drink equals 12 oz of beer, 5 oz of wine, or 1 oz of hard liquor.  Work with your health care provider to maintain a healthy body weight or to lose weight. Ask what an ideal weight is for you.  Get at least 30 minutes of exercise that causes your heart to beat faster (aerobic exercise) most days of the week. Activities may include walking, swimming, or biking.  Work with your health care provider or diet and nutrition specialist (dietitian) to adjust your eating plan to your individual calorie needs. Reading food labels   Check food labels for the amount of sodium per serving. Choose foods with less than 5 percent of the Daily Value of sodium. Generally, foods with less than 300 mg of sodium per serving fit into this eating plan.  To find whole grains, look for the word "whole" as the first word in the ingredient list. Shopping  Buy products labeled as "low-sodium" or "no salt added."  Buy fresh foods. Avoid canned foods and premade or frozen meals. Cooking  Avoid adding salt when cooking. Use  salt-free seasonings or herbs instead of table salt or sea salt. Check with your health care provider or pharmacist before using salt substitutes.  Do not fry foods. Cook foods using healthy methods such as baking, boiling, grilling, and broiling instead.  Cook with heart-healthy oils, such as olive, canola, soybean, or sunflower oil. Meal planning  Eat a balanced diet that includes: ? 5 or more servings of fruits and vegetables each day. At each meal, try to fill half of your plate with fruits and vegetables. ? Up to 6-8 servings of whole grains each day. ? Less than 6 oz of lean meat, poultry, or fish each day. A 3-oz serving of meat is about the same size as a deck of cards. One egg equals 1 oz. ? 2 servings of low-fat dairy each day. ? A serving of nuts, seeds, or beans 5 times each week. ? Heart-healthy fats. Healthy fats called Omega-3 fatty acids are found in foods such as flaxseeds and coldwater fish, like sardines, salmon, and mackerel.  Limit how much you eat of the following: ? Canned or prepackaged foods. ? Food that is high in trans fat, such as fried foods. ? Food that is high in saturated fat, such as fatty meat. ? Sweets, desserts, sugary drinks, and other foods with added sugar. ? Full-fat dairy products.  Do not salt foods before eating.  Try to eat at least 2 vegetarian meals each week.  Eat more home-cooked food  and less restaurant, buffet, and fast food.  When eating at a restaurant, ask that your food be prepared with less salt or no salt, if possible. What foods are recommended? The items listed may not be a complete list. Talk with your dietitian about what dietary choices are best for you. Grains Whole-grain or whole-wheat bread. Whole-grain or whole-wheat pasta. Brown rice. [place]. Bulgur. Whole-grain and low-sodium cereals. Pita bread. Low-fat, low-sodium crackers. Whole-wheat flour tortillas. Vegetables Fresh or frozen vegetables (raw, steamed,  roasted, or grilled). Low-sodium or reduced-sodium tomato and vegetable juice. Low-sodium or reduced-sodium tomato sauce and tomato paste. Low-sodium or reduced-sodium canned vegetables. Fruits All fresh, dried, or frozen fruit. Canned fruit in natural juice (without added sugar). Meat and other protein foods Skinless chicken or [place]. Ground chicken or [place]. Pork with fat trimmed off. Fish and seafood. Egg whites. Dried beans, peas, or lentils. Unsalted nuts, nut butters, and seeds. Unsalted canned beans. Lean cuts of beef with fat trimmed off. Low-sodium, lean deli meat. Dairy Low-fat (1%) or fat-free (skim) milk. Fat-free, low-fat, or reduced-fat cheeses. Nonfat, low-sodium ricotta or cottage cheese. Low-fat or nonfat yogurt. Low-fat, low-sodium cheese. Fats and oils Soft margarine without trans fats. Vegetable oil. Low-fat, reduced-fat, or light mayonnaise and salad dressings (reduced-sodium). Canola, safflower, olive, soybean, and sunflower oils. Avocado. Seasoning and other foods Herbs. Spices. Seasoning mixes without salt. Unsalted popcorn and pretzels. Fat-free sweets. What foods are not recommended? The items listed may not be a complete list. Talk with your dietitian about what dietary choices are best for you. Grains Baked goods made with fat, such as croissants, muffins, or some breads. Dry pasta or rice meal packs. Vegetables Creamed or fried vegetables. Vegetables in a cheese sauce. Regular canned vegetables (not low-sodium or reduced-sodium). Regular canned tomato sauce and paste (not low-sodium or reduced-sodium). Regular tomato and vegetable juice (not low-sodium or reduced-sodium). Angie Fava. Olives. Fruits Canned fruit in a light or heavy syrup. Fried fruit. Fruit in cream or butter sauce. Meat and other protein foods Fatty cuts of meat. Ribs. Fried meat. Berniece Salines. Sausage. Bologna and other processed lunch meats. Salami. Fatback. Hotdogs. Bratwurst. Salted nuts and seeds. Canned  beans with added salt. Canned or smoked fish. Whole eggs or egg yolks. Chicken or [place] with skin. Dairy Whole or 2% milk, cream, and half-and-half. Whole or full-fat cream cheese. Whole-fat or sweetened yogurt. Full-fat cheese. Nondairy creamers. Whipped toppings. Processed cheese and cheese spreads. Fats and oils Butter. Stick margarine. Lard. Shortening. Ghee. Bacon fat. Tropical oils, such as coconut, palm kernel, or palm oil. Seasoning and other foods Salted popcorn and pretzels. Onion salt, garlic salt, seasoned salt, table salt, and sea salt. Worcestershire sauce. Tartar sauce. Barbecue sauce. Teriyaki sauce. Soy sauce, including reduced-sodium. Steak sauce. Canned and packaged gravies. Fish sauce. Oyster sauce. Cocktail sauce. Horseradish that you find on the shelf. Ketchup. Mustard. Meat flavorings and tenderizers. Bouillon cubes. Hot sauce and Tabasco sauce. Premade or packaged marinades. Premade or packaged taco seasonings. Relishes. Regular salad dressings. Where to find more information:  National Heart, Lung, and [place]: https://wilson-eaton.com/  American Heart Association: www.heart.org Summary  The DASH eating plan is a healthy eating plan that has been shown to reduce high blood pressure (hypertension). It may also reduce your risk for type 2 diabetes, heart disease, and stroke.  With the DASH eating plan, you should limit salt (sodium) intake to 2,300 mg a day. If you have hypertension, you may need to reduce your sodium intake to 1,500 mg a  day.  When on the DASH eating plan, aim to eat more fresh fruits and vegetables, whole grains, lean proteins, low-fat dairy, and heart-healthy fats.  Work with your health care provider or diet and nutrition specialist (dietitian) to adjust your eating plan to your individual calorie needs. This information is not intended to replace advice given to you by your health care provider. Make sure you discuss any questions you have with your  health care provider. Document Revised: 06/26/2017 Document Reviewed: 07/07/2016 Elsevier Patient Education  2020 [employer].

## 2020-01-03 NOTE — Progress Notes (Signed)
Subjective:  Patient ID: [name], female    DOB: [birth year]  Age: 59 y.o. MRN: [id number]  CC: Establish Care (Pt has no concerns, she will have a colonoscopy Thursday)  HPI Uterus and cervix absent per patient Ovaries present Surgeries in past: exploratory laparoscopy, appendectomy No hx of anesthesia adverse reaction or known CAD. No hx or FHx of DVT or PE  Hx of statin intolerance: atorvastatin 10mg   Reviewed lab results from previous pcp: 11/07/2019 (CBC, CMP, HgbA1c, lipid panel)  Reviewed past Medical, Social and Family history today.  Outpatient Medications Prior to Visit  Medication Sig Dispense Refill  . ALPRAZolam (XANAX) 0.25 MG tablet Take 0.25 mg by mouth 2 (two) times daily as needed.    Javier Docker Oil 500 MG CAPS Take by mouth.    . Loratadine (CLARITIN PO) Take 10 mg by mouth daily.     . lansoprazole (PREVACID) 30 MG capsule Take 30 mg by mouth daily at 12 noon.    Marland Kitchen lisinopril-hydrochlorothiazide (PRINZIDE,ZESTORETIC) 20-25 MG tablet Take 1 tablet by mouth daily.  3  . omeprazole (PRILOSEC) 20 MG capsule Take 20 mg by mouth Daily.      No facility-administered medications prior to visit.    ROS See HPI  Objective:  BP 110/72 (BP Location: Right Arm, Patient Position: Sitting, Cuff Size: Normal)   Pulse 97   Temp (!) 97.5 F (36.4 C) (Temporal)   Ht 5\' 4"  (1.626 m)   Wt 206 lb 9.6 oz (93.7 kg)   SpO2 97%   BMI 35.46 kg/m   BP Readings from Last 3 Encounters:  01/03/20 110/72  11/22/19 118/68  04/09/18 114/74    Wt Readings from Last 3 Encounters:  01/03/20 206 lb 9.6 oz (93.7 kg)  11/22/19 207 lb (93.9 kg)  01/11/16 195 lb (88.5 kg)    Physical Exam Vitals reviewed.  Constitutional:      Appearance: She is obese.  Cardiovascular:     Rate and Rhythm: Normal rate and regular rhythm.     Pulses: Normal pulses.     Heart sounds: Normal heart sounds.  Pulmonary:     Effort: Pulmonary effort is normal.     Breath sounds: Normal  breath sounds.  Abdominal:     General: Bowel sounds are normal.     Palpations: Abdomen is soft.  Musculoskeletal:     Cervical back: Normal range of motion and neck supple.     Right lower leg: No edema.     Left lower leg: No edema.  Skin:    General: Skin is warm and dry.  Neurological:     Mental Status: She is alert and oriented to person, place, and time.  Psychiatric:        Mood and Affect: Mood normal.        Behavior: Behavior normal.        Thought Content: Thought content normal.    Lab Results  Component Value Date   WBC 17.0 (H) 04/09/2018   HGB 18.2 (H) 04/09/2018   HCT 53.3 (H) 04/09/2018   PLT 327 04/09/2018   GLUCOSE 200 (H) 04/09/2018   ALT 44 04/09/2018   AST 40 04/09/2018   NA 138 04/09/2018   K 4.0 04/09/2018   CL 102 04/09/2018   CREATININE 1.07 (H) 04/09/2018   BUN 25 (H) 04/09/2018   CO2 23 04/09/2018   INR 1.01 02/18/2011   Assessment & Plan:  This visit occurred during the SARS-CoV-2 public health  emergency.  Safety protocols were in place, including screening questions prior to the visit, additional usage of staff PPE, and extensive cleaning of exam room while observing appropriate contact time as indicated for disinfecting solutions.   ECG: NSR, no ST segment or T wave abnormality, no previous ECG to compare.  [name] was seen today for establish care.  Diagnoses and all orders for this visit:  Preoperative clearance -     EKG 12-Lead  Gastroesophageal reflux disease, unspecified whether esophagitis present -     lansoprazole (PREVACID) 30 MG capsule; Take 1 capsule (30 mg total) by mouth daily at 12 noon.  Essential hypertension -     lisinopril-hydrochlorothiazide (ZESTORETIC) 20-25 MG tablet; Take 1 tablet by mouth daily.  Breast cancer screening by mammogram -     MM DIGITAL SCREENING BILATERAL; Future  Tobacco use  Hyperlipidemia with target LDL less than 100 -     rosuvastatin (CRESTOR) 5 MG tablet; Take 3x/week   I have  discontinued [name]. [name]'s omeprazole. I have also changed her lansoprazole and lisinopril-hydrochlorothiazide. Additionally, I am having her start on rosuvastatin. Lastly, I am having her maintain her Loratadine (CLARITIN PO), ALPRAZolam, and Krill Oil.  Meds ordered this encounter  Medications  . lansoprazole (PREVACID) 30 MG capsule    Sig: Take 1 capsule (30 mg total) by mouth daily at 12 noon.    Dispense:  90 capsule    Refill:  1  . rosuvastatin (CRESTOR) 5 MG tablet    Sig: Take 3x/week    Dispense:  30 tablet    Refill:  5    Order Specific Question:   Supervising Provider    Answer:   Ronnald Nian [[id number]]  . lisinopril-hydrochlorothiazide (ZESTORETIC) 20-25 MG tablet    Sig: Take 1 tablet by mouth daily.    Dispense:  90 tablet    Refill:  1    Order Specific Question:   Supervising Provider    Answer:   Ronnald Nian [[id number]]    Problem List Items Addressed This Visit      Cardiovascular and Mediastinum   HTN (hypertension)   Relevant Medications   rosuvastatin (CRESTOR) 5 MG tablet   lisinopril-hydrochlorothiazide (ZESTORETIC) 20-25 MG tablet     Digestive   GERD (gastroesophageal reflux disease)   Relevant Medications   lansoprazole (PREVACID) 30 MG capsule     Other   Hyperlipidemia with target LDL less than 100   Relevant Medications   rosuvastatin (CRESTOR) 5 MG tablet   lisinopril-hydrochlorothiazide (ZESTORETIC) 20-25 MG tablet   Tobacco use    1/2ppd x26yrs Quit once at age 35, no help used No ready to quit.       Other Visit Diagnoses    Preoperative clearance    -  Primary   Relevant Orders   EKG 12-Lead (Completed)   Breast cancer screening by mammogram       Relevant Orders   MM DIGITAL SCREENING BILATERAL      Follow-up: Return in about 3 months (around 04/04/2020) for DM and HTN, hyperlipidemia (fasting, 31mins, F2F).  Wilfred Lacy, NP

## 2020-01-03 NOTE — Assessment & Plan Note (Signed)
1/2ppd x64yrs Quit once at age 59, no help used No ready to quit.

## 2020-01-05 ENCOUNTER — Encounter: Payer: Self-pay | Admitting: Gastroenterology

## 2020-01-05 ENCOUNTER — Other Ambulatory Visit: Payer: Self-pay

## 2020-01-05 ENCOUNTER — Ambulatory Visit (AMBULATORY_SURGERY_CENTER): Payer: 59 | Admitting: Gastroenterology

## 2020-01-05 VITALS — BP 116/73 | HR 80 | Temp 98.7°F | Resp 14 | Ht 63.75 in | Wt 207.0 lb

## 2020-01-05 DIAGNOSIS — Z1211 Encounter for screening for malignant neoplasm of colon: Secondary | ICD-10-CM | POA: Diagnosis not present

## 2020-01-05 DIAGNOSIS — D124 Benign neoplasm of descending colon: Secondary | ICD-10-CM | POA: Diagnosis not present

## 2020-01-05 DIAGNOSIS — D128 Benign neoplasm of rectum: Secondary | ICD-10-CM | POA: Diagnosis not present

## 2020-01-05 DIAGNOSIS — R112 Nausea with vomiting, unspecified: Secondary | ICD-10-CM

## 2020-01-05 DIAGNOSIS — D129 Benign neoplasm of anus and anal canal: Secondary | ICD-10-CM

## 2020-01-05 DIAGNOSIS — D125 Benign neoplasm of sigmoid colon: Secondary | ICD-10-CM

## 2020-01-05 DIAGNOSIS — R1084 Generalized abdominal pain: Secondary | ICD-10-CM

## 2020-01-05 DIAGNOSIS — D127 Benign neoplasm of rectosigmoid junction: Secondary | ICD-10-CM | POA: Diagnosis not present

## 2020-01-05 MED ORDER — SODIUM CHLORIDE 0.9 % IV SOLN: 500.0000 mL | Freq: Once | INTRAVENOUS | Status: DC

## 2020-01-05 NOTE — Patient Instructions (Signed)
Discharge instructions given. Handouts on polyps and Diverticulosis. Resume previous medications. See path report for recommendations. YOU HAD AN ENDOSCOPIC PROCEDURE TODAY AT [facility]:   Refer to the procedure report that was given to you for any specific questions about what was found during the examination.  If the procedure report does not answer your questions, please call your gastroenterologist to clarify.  If you requested that your care partner not be given the details of your procedure findings, then the procedure report has been included in a sealed envelope for you to review at your convenience later.  YOU SHOULD EXPECT: Some feelings of bloating in the abdomen. Passage of more gas than usual.  Walking can help get rid of the air that was put into your GI tract during the procedure and reduce the bloating. If you had a lower endoscopy (such as a colonoscopy or flexible sigmoidoscopy) you may notice spotting of blood in your stool or on the toilet paper. If you underwent a bowel prep for your procedure, you may not have a normal bowel movement for a few days.  Please Note:  You might notice some irritation and congestion in your nose or some drainage.  This is from the oxygen used during your procedure.  There is no need for concern and it should clear up in a day or so.  SYMPTOMS TO REPORT IMMEDIATELY:   Following lower endoscopy (colonoscopy or flexible sigmoidoscopy):  Excessive amounts of blood in the stool  Significant tenderness or worsening of abdominal pains  Swelling of the abdomen that is new, acute  Fever of 100F or higher    For urgent or emergent issues, a gastroenterologist can be reached at any hour by calling [phone]. Do not use MyChart messaging for urgent concerns.    DIET:  We do recommend a small meal at first, but then you may proceed to your regular diet.  Drink plenty of fluids but you should avoid alcoholic beverages for 24  hours.  ACTIVITY:  You should plan to take it easy for the rest of today and you should NOT DRIVE or use heavy machinery until tomorrow (because of the sedation medicines used during the test).    FOLLOW UP: Our staff will call the number listed on your records 48-72 hours following your procedure to check on you and address any questions or concerns that you may have regarding the information given to you following your procedure. If we do not reach you, we will leave a message.  We will attempt to reach you two times.  During this call, we will ask if you have developed any symptoms of COVID 19. If you develop any symptoms (ie: fever, flu-like symptoms, shortness of breath, cough etc.) before then, please call [phone].  If you test positive for Covid 19 in the 2 weeks post procedure, please call and report this information to [place].    If any biopsies were taken you will be contacted by phone or by letter within the next 1-3 weeks.  Please call us at [phone] if you have not heard about the biopsies in 3 weeks.    SIGNATURES/CONFIDENTIALITY: You and/or your care partner have signed paperwork which will be entered into your electronic medical record.  These signatures attest to the fact that that the information above on your After Visit Summary has been reviewed and is understood.  Full responsibility of the confidentiality of this discharge information lies with you and/or your care-partner.

## 2020-01-05 NOTE — Op Note (Addendum)
[place] Patient Name: [name] Procedure Date: 01/05/2020 11:34 AM MRN: [id number] Endoscopist: Thornton Park MD, MD Age: 59 Referring MD:  Date of Birth: 02-06-1961 Gender: Female Account #: [id number] Procedure:                Colonoscopy Indications:              Screening for colon cancer: Family history of colon                            polyps in distant relative(s) (Mom with colon                            polyps), This is the patient's first colonoscopy Medicines:                Monitored Anesthesia Care Procedure:                Pre-Anesthesia Assessment:                           - Prior to the procedure, a History and Physical                            was performed, and patient medications and                            allergies were reviewed. The patient's tolerance of                            previous anesthesia was also reviewed. The risks                            and benefits of the procedure and the sedation                            options and risks were discussed with the patient.                            All questions were answered, and informed consent                            was obtained. Prior Anticoagulants: The patient has                            taken no previous anticoagulant or antiplatelet                            agents. ASA Grade Assessment: II - A patient with                            mild systemic disease. After reviewing the risks                            and benefits, the patient was deemed in  satisfactory condition to undergo the procedure.                           After obtaining informed consent, the colonoscope                            was passed under direct vision. Throughout the                            procedure, the patient's blood pressure, pulse, and                            oxygen saturations were monitored continuously. The                             Colonoscope was introduced through the anus and                            advanced to the 4 cm into the ileum. A second                            forward view of the right colon. The colonoscopy                            was performed without difficulty. The patient                            tolerated the procedure well. The quality of the                            bowel preparation was good. The terminal ileum,                            ileocecal valve, appendiceal orifice, and rectum                            were photographed. Scope In: 11:40:36 AM Scope Out: 11:58:03 AM Scope Withdrawal Time: 0 hours 13 minutes 46 seconds  Total Procedure Duration: 0 hours 17 minutes 27 seconds  Findings:                 The perianal and digital rectal examinations were                            normal except for small hemorrhoids.                           A few small-mouthed diverticula were found in the                            sigmoid colon.                           Three flat polyps were found in the rectum. The  polyps were 1 mm in size. These polyps were removed                            with a cold biopsy forceps. Resection and retrieval                            were complete. Estimated blood loss was minimal.                           A 3 mm polyp was found in the sigmoid colon. The                            polyp was sessile. The polyp was removed with a                            cold snare. Resection and retrieval were complete.                            Estimated blood loss was minimal.                           A 1 mm polyp was found in the descending colon. The                            polyp was sessile. The polyp was removed with a                            cold snare. Resection and retrieval were complete.                            Estimated blood loss was minimal.                           The exam was otherwise without abnormality on                             direct and retroflexion views. Complications:            No immediate complications. Estimated blood loss:                            Minimal. Estimated Blood Loss:     Estimated blood loss was minimal. Impression:               - Diverticulosis in the sigmoid colon.                           - Three 1 mm polyps in the rectum, removed with a                            cold biopsy forceps. Resected and retrieved.                           - One 3  mm polyp in the sigmoid colon, removed with                            a cold snare. Resected and retrieved.                           - One 1 mm polyp in the descending colon, removed                            with a cold snare. Resected and retrieved.                           - The examination was otherwise normal on direct                            and retroflexion views. Recommendation:           - Patient has a contact number available for                            emergencies. The signs and symptoms of potential                            delayed complications were discussed with the                            patient. Return to normal activities tomorrow.                            Written discharge instructions were provided to the                            patient.                           - Resume previous diet. High fiber diet encouraged.                           - Continue present medications.                           - Await pathology results.                           - Repeat colonoscopy date to be determined after                            pending pathology results are reviewed for                            surveillance.                           - Emerging evidence supports eating a diet of  fruits, vegetables, grains, calcium, and yogurt                            while reducing red meat and alcohol may reduce the                            risk of colon cancer.                            - Thank you for allowing me to be involved in your                            colon cancer prevention. Thornton Park MD, MD 01/05/2020 12:06:56 PM This report has been signed electronically. [place],

## 2020-01-05 NOTE — Progress Notes (Signed)
Called to room to assist during endoscopic procedure.  Patient ID and intended procedure confirmed with present staff. Received instructions for my participation in the procedure from the performing physician.  

## 2020-01-05 NOTE — Progress Notes (Signed)
A/ox3, pleased with MAC, report to RN 

## 2020-01-06 ENCOUNTER — Encounter: Payer: Self-pay | Admitting: Nurse Practitioner

## 2020-01-09 ENCOUNTER — Telehealth: Payer: Self-pay

## 2020-01-09 NOTE — Telephone Encounter (Signed)
  Follow up Call-  Call back number 01/05/2020  Post procedure Call Back phone  # [phone]  Permission to leave phone message Yes  Some recent data might be hidden     Patient questions:  Do you have a fever, pain , or abdominal swelling? No. Pain Score  0 *  Have you tolerated food without any problems? Yes.    Have you been able to return to your normal activities? Yes.    Do you have any questions about your discharge instructions: Diet   No. Medications  No. Follow up visit  No.  Do you have questions or concerns about your Care? No.  Actions: * If pain score is 4 or above: No action needed, pain <4.  1. Have you developed a fever since your procedure? no  2.   Have you had an respiratory symptoms (SOB or cough) since your procedure? no  3.   Have you tested positive for COVID 19 since your procedure no  4.   Have you had any family members/close contacts diagnosed with the COVID 19 since your procedure?  no   If yes to any of these questions please route to Joylene John, RN and Erenest Rasher, RN

## 2020-01-10 ENCOUNTER — Encounter: Payer: Self-pay | Admitting: Gastroenterology

## 2020-01-12 ENCOUNTER — Other Ambulatory Visit: Payer: Self-pay

## 2020-01-12 ENCOUNTER — Ambulatory Visit
Admission: RE | Admit: 2020-01-12 | Discharge: 2020-01-12 | Disposition: A | Discharge status: Home / Self Care | Payer: 59 | Source: Ambulatory Visit | Attending: Nurse Practitioner | Admitting: Nurse Practitioner

## 2020-01-12 DIAGNOSIS — Z1231 Encounter for screening mammogram for malignant neoplasm of breast: Secondary | ICD-10-CM

## 2020-03-13 ENCOUNTER — Telehealth: Payer: Self-pay | Admitting: Nurse Practitioner

## 2020-03-13 NOTE — Telephone Encounter (Signed)
Left detailed message on patient voicemail and instructed her to call us with any questions or concerns.

## 2020-03-13 NOTE — Telephone Encounter (Signed)
Patient is scheduled for her 1st COVID vaccine on Friday and wants to know if she needs to get the antibody test or would it be pointless. Please give her a call back at [phone] and advise.

## 2020-03-13 NOTE — Telephone Encounter (Signed)
No need for that.

## 2020-03-13 NOTE — Telephone Encounter (Signed)
See message below and please advise.

## 2020-04-05 ENCOUNTER — Other Ambulatory Visit: Payer: Self-pay

## 2020-04-06 ENCOUNTER — Encounter: Payer: Self-pay | Admitting: Nurse Practitioner

## 2020-04-06 ENCOUNTER — Ambulatory Visit: Payer: 59 | Admitting: Nurse Practitioner

## 2020-04-06 VITALS — BP 124/84 | HR 84 | Temp 97.8°F | Ht 63.0 in | Wt 209.2 lb

## 2020-04-06 DIAGNOSIS — Z720 Tobacco use: Secondary | ICD-10-CM

## 2020-04-06 DIAGNOSIS — R7989 Other specified abnormal findings of blood chemistry: Secondary | ICD-10-CM | POA: Diagnosis not present

## 2020-04-06 DIAGNOSIS — R7303 Prediabetes: Secondary | ICD-10-CM

## 2020-04-06 DIAGNOSIS — E785 Hyperlipidemia, unspecified: Secondary | ICD-10-CM | POA: Diagnosis not present

## 2020-04-06 DIAGNOSIS — D72829 Elevated white blood cell count, unspecified: Secondary | ICD-10-CM | POA: Diagnosis not present

## 2020-04-06 DIAGNOSIS — I10 Essential (primary) hypertension: Secondary | ICD-10-CM

## 2020-04-06 LAB — LIPID PANEL
Cholesterol: 210 mg/dL — ABNORMAL HIGH (ref 0–200)
HDL: 41 mg/dL
LDL Cholesterol: 135 mg/dL — ABNORMAL HIGH (ref 0–99)
NonHDL: 169.03
Total CHOL/HDL Ratio: 5
Triglycerides: 169 mg/dL — ABNORMAL HIGH (ref 0.0–149.0)
VLDL: 33.8 mg/dL (ref 0.0–40.0)

## 2020-04-06 LAB — HEPATIC FUNCTION PANEL
ALT: 40 U/L — ABNORMAL HIGH (ref 0–35)
AST: 50 U/L — ABNORMAL HIGH (ref 0–37)
Albumin: 4 g/dL (ref 3.5–5.2)
Alkaline Phosphatase: 110 U/L (ref 39–117)
Bilirubin, Direct: 0.1 mg/dL (ref 0.0–0.3)
Total Bilirubin: 0.6 mg/dL (ref 0.2–1.2)
Total Protein: 7.9 g/dL (ref 6.0–8.3)

## 2020-04-06 LAB — CBC WITH DIFFERENTIAL/PLATELET
Basophils Absolute: 0.1 10*3/uL (ref 0.0–0.1)
Basophils Relative: 1.2 % (ref 0.0–3.0)
Eosinophils Absolute: 0.2 10*3/uL (ref 0.0–0.7)
Eosinophils Relative: 2 % (ref 0.0–5.0)
HCT: 46.1 % — ABNORMAL HIGH (ref 36.0–46.0)
Hemoglobin: 15.7 g/dL — ABNORMAL HIGH (ref 12.0–15.0)
Lymphocytes Relative: 48.2 % — ABNORMAL HIGH (ref 12.0–46.0)
Lymphs Abs: 3.8 10*3/uL (ref 0.7–4.0)
MCHC: 34.1 g/dL (ref 30.0–36.0)
MCV: 99.3 fl (ref 78.0–100.0)
Monocytes Absolute: 0.7 10*3/uL (ref 0.1–1.0)
Monocytes Relative: 9.4 % (ref 3.0–12.0)
Neutro Abs: 3.1 10*3/uL (ref 1.4–7.7)
Neutrophils Relative %: 39.2 % — ABNORMAL LOW (ref 43.0–77.0)
Platelets: 232 10*3/uL (ref 150.0–400.0)
RBC: 4.64 Mil/uL (ref 3.87–5.11)
RDW: 13.1 % (ref 11.5–15.5)
WBC: 7.9 10*3/uL (ref 4.0–10.5)

## 2020-04-06 LAB — BASIC METABOLIC PANEL WITH GFR
BUN: 13 mg/dL (ref 6–23)
CO2: 29 meq/L (ref 19–32)
Calcium: 9.2 mg/dL (ref 8.4–10.5)
Chloride: 99 meq/L (ref 96–112)
Creatinine, Ser: 0.73 mg/dL (ref 0.40–1.20)
GFR: 82.01 mL/min
Glucose, Bld: 123 mg/dL — ABNORMAL HIGH (ref 70–99)
Potassium: 4.6 meq/L (ref 3.5–5.1)
Sodium: 134 meq/L — ABNORMAL LOW (ref 135–145)

## 2020-04-06 LAB — HEMOGLOBIN A1C: Hgb A1c MFr Bld: 6.3 % (ref 4.6–6.5)

## 2020-04-06 LAB — CK: Total CK: 141 U/L (ref 7–177)

## 2020-04-06 NOTE — Assessment & Plan Note (Signed)
BP at goal with lisinopril/HCTZ BP Readings from Last 3 Encounters:  04/06/20 124/84  01/05/20 116/73  01/03/20 110/72   Repeat BMP today Continue current medication F/up in 57months

## 2020-04-06 NOTE — Assessment & Plan Note (Addendum)
Unable to tolerate atorvastatin and Crestor at low doses. Leg pain and weakness resolved after discontinuation of medication.  Lipid Panel     Component Value Date/Time   CHOL 210 (H) 04/06/2020 1014   TRIG 169.0 (H) 04/06/2020 1014   HDL 41.00 04/06/2020 1014   CHOLHDL 5 04/06/2020 1014   VLDL 33.8 04/06/2020 1014   LDLCALC 135 (H) 04/06/2020 1014   Abnormal lipid panel, start zetia and lovaza in place of statin. D/c krill oil F/up in 3months for repeat lipid panel (fasting)

## 2020-04-06 NOTE — Assessment & Plan Note (Addendum)
No cough, no SOB, no claudication, no LE edema, no neuropathy Cut down to 1/4ppd Advised about need for CT chest low dose to screen for lung cancer Encourage to continue cutting down

## 2020-04-06 NOTE — Patient Instructions (Addendum)
Let me know if you ar interested in referral to pulmonology for CT chest to screen for lung screen.  Stable cbc, CK and BMP Mild elevation in LFTs: AST and ALT. Will continue to monitor at this time.  HgbA1c of 6.3: prediabetes. With current weight, HTN and abnormal lipid panel, I recommend use of metformin. Are you ok with rx to be sent? Abnormal lipid panel, start zetia in place of statin.  Schedule nurse visit for influenza vaccine 2month after second moderna vaccine.

## 2020-04-06 NOTE — Progress Notes (Addendum)
Subjective:  Patient ID: [name], female    DOB: 1961/04/24  Age: 59 y.o. MRN: [id number]  CC: Follow-up (3 month f/u on DM/ HTN/ Hyperlipidemia. Pt states cholesterol medication (Rosuvastatin) has been causing body aches since starting it. )  HPI  Tobacco use No cough, no SOB, no claudication, no LE edema, no neuropathy Cut down to 1/4ppd Advised about need for CT chest low dose to screen for lung cancer Encourage to continue cutting down  HTN (hypertension) BP at goal with lisinopril/HCTZ BP Readings from Last 3 Encounters:  04/06/20 124/84  01/05/20 116/73  01/03/20 110/72   Repeat BMP today Continue current medication F/up in 25months  Hyperlipidemia with target LDL less than 100 Unable to tolerate atorvastatin and Crestor at low doses. Leg pain and weakness resolved after discontinuation of medication.  Lipid Panel     Component Value Date/Time   CHOL 210 (H) 04/06/2020 1014   TRIG 169.0 (H) 04/06/2020 1014   HDL 41.00 04/06/2020 1014   CHOLHDL 5 04/06/2020 1014   VLDL 33.8 04/06/2020 1014   LDLCALC 135 (H) 04/06/2020 1014   Abnormal lipid panel, start zetia and lovaza in place of statin. D/c krill oil F/up in 27months for repeat lipid panel (fasting)    Elevated LFTs Stable AST/ALT: 50/40 Normal ALP Repeat in 66months  Prediabetes HgbA1c of 6.3 Start metformin 500mg  daily   Reviewed past Medical, Social and Family history today.  Outpatient Medications Prior to Visit  Medication Sig Dispense Refill  . ALPRAZolam (XANAX) 0.25 MG tablet Take 0.25 mg by mouth 2 (two) times daily as needed.     . lansoprazole (PREVACID) 30 MG capsule Take 1 capsule (30 mg total) by mouth daily at 12 noon. 90 capsule 1  . Loratadine (CLARITIN PO) Take 10 mg by mouth daily.     Javier Docker Oil 500 MG CAPS Take by mouth.    Marland Kitchen lisinopril-hydrochlorothiazide (ZESTORETIC) 20-25 MG tablet Take 1 tablet by mouth daily. 90 tablet 1  . rosuvastatin (CRESTOR) 5 MG tablet  Take 3x/week (Patient not taking: Reported on 01/05/2020) 30 tablet 5   No facility-administered medications prior to visit.    ROS See HPI  Objective:  BP 124/84 (BP Location: Left Arm, Patient Position: Sitting, Cuff Size: Normal)   Pulse 84   Temp 97.8 F (36.6 C) (Temporal)   Ht 5\' 3"  (1.6 m)   Wt 209 lb 3.2 oz (94.9 kg)   SpO2 98%   BMI 37.06 kg/m   Physical Exam Vitals reviewed.  Constitutional:      Appearance: She is obese.  Cardiovascular:     Rate and Rhythm: Normal rate and regular rhythm.     Pulses: Normal pulses.     Heart sounds: Normal heart sounds.  Pulmonary:     Effort: Pulmonary effort is normal.     Breath sounds: Normal breath sounds.  Musculoskeletal:     Right lower leg: No edema.     Left lower leg: No edema.  Skin:    General: Skin is warm and dry.  Neurological:     Mental Status: She is alert and oriented to person, place, and time.  Psychiatric:        Mood and Affect: Mood normal.        Behavior: Behavior normal.        Thought Content: Thought content normal.    Assessment & Plan:  This visit occurred during the SARS-CoV-2 public health emergency.  Safety protocols were  in place, including screening questions prior to the visit, additional usage of staff PPE, and extensive cleaning of exam room while observing appropriate contact time as indicated for disinfecting solutions.   Maryelizabeth was seen today for follow-up.  Diagnoses and all orders for this visit:  Essential hypertension -     Basic metabolic panel -     lisinopril-hydrochlorothiazide (ZESTORETIC) 20-12.5 MG tablet; Take 1 tablet by mouth daily. -     metFORMIN (GLUCOPHAGE) 500 MG tablet; Take 1 tablet (500 mg total) by mouth daily with breakfast.  Hyperlipidemia with target LDL less than 100 -     Hepatic function panel -     Lipid panel -     CK (Creatine Kinase) -     ezetimibe (ZETIA) 10 MG tablet; Take 1 tablet (10 mg total) by mouth daily. -     omega-3 acid ethyl  esters (LOVAZA) 1 g capsule; Take 1 capsule (1 g total) by mouth 2 (two) times daily. -     metFORMIN (GLUCOPHAGE) 500 MG tablet; Take 1 tablet (500 mg total) by mouth daily with breakfast.  Elevated LFTs -     Hepatic function panel  Leukocytosis, unspecified type -     CBC with Differential/Platelet  Prediabetes -     Hemoglobin A1c -     metFORMIN (GLUCOPHAGE) 500 MG tablet; Take 1 tablet (500 mg total) by mouth daily with breakfast.  Tobacco use    Problem List Items Addressed This Visit      Cardiovascular and Mediastinum   HTN (hypertension) - Primary    BP at goal with lisinopril/HCTZ BP Readings from Last 3 Encounters:  04/06/20 124/84  01/05/20 116/73  01/03/20 110/72   Repeat BMP today Continue current medication F/up in 9months      Relevant Medications   ezetimibe (ZETIA) 10 MG tablet   omega-3 acid ethyl esters (LOVAZA) 1 g capsule   lisinopril-hydrochlorothiazide (ZESTORETIC) 20-12.5 MG tablet   metFORMIN (GLUCOPHAGE) 500 MG tablet   Other Relevant Orders   Basic metabolic panel (Completed)     Other   Elevated LFTs    Stable AST/ALT: 50/40 Normal ALP Repeat in 26months      Relevant Orders   Hepatic function panel (Completed)   Hyperlipidemia with target LDL less than 100    Unable to tolerate atorvastatin and Crestor at low doses. Leg pain and weakness resolved after discontinuation of medication.  Lipid Panel     Component Value Date/Time   CHOL 210 (H) 04/06/2020 1014   TRIG 169.0 (H) 04/06/2020 1014   HDL 41.00 04/06/2020 1014   CHOLHDL 5 04/06/2020 1014   VLDL 33.8 04/06/2020 1014   LDLCALC 135 (H) 04/06/2020 1014   Abnormal lipid panel, start zetia and lovaza in place of statin. D/c krill oil F/up in 47months for repeat lipid panel (fasting)        Relevant Medications   ezetimibe (ZETIA) 10 MG tablet   omega-3 acid ethyl esters (LOVAZA) 1 g capsule   lisinopril-hydrochlorothiazide (ZESTORETIC) 20-12.5 MG tablet   metFORMIN  (GLUCOPHAGE) 500 MG tablet   Other Relevant Orders   Hepatic function panel (Completed)   Lipid panel (Completed)   CK (Creatine Kinase) (Completed)   Prediabetes    HgbA1c of 6.3 Start metformin 500mg  daily       Relevant Medications   metFORMIN (GLUCOPHAGE) 500 MG tablet   Other Relevant Orders   Hemoglobin A1c (Completed)   Tobacco use    No cough, no  SOB, no claudication, no LE edema, no neuropathy Cut down to 1/4ppd Advised about need for CT chest low dose to screen for lung cancer Encourage to continue cutting down       Other Visit Diagnoses    Leukocytosis, unspecified type       Relevant Orders   CBC with Differential/Platelet (Completed)      Follow-up: Return in about 6 months (around 10/04/2020) for DM and HTN, hyperlipidemia (fasting).  Wilfred Lacy, NP

## 2020-04-09 DIAGNOSIS — E119 Type 2 diabetes mellitus without complications: Secondary | ICD-10-CM

## 2020-04-09 DIAGNOSIS — R7303 Prediabetes: Secondary | ICD-10-CM | POA: Insufficient documentation

## 2020-04-09 HISTORY — DX: Type 2 diabetes mellitus without complications: E11.9

## 2020-04-09 MED ORDER — OMEGA-3-ACID ETHYL ESTERS 1 G PO CAPS: 1.0000 g | ORAL_CAPSULE | Freq: Two times a day (BID) | ORAL | 5 refills | Status: DC

## 2020-04-09 MED ORDER — LISINOPRIL-HYDROCHLOROTHIAZIDE 20-12.5 MG PO TABS: 1.0000 | ORAL_TABLET | Freq: Every day | ORAL | 3 refills | Status: DC

## 2020-04-09 MED ORDER — EZETIMIBE 10 MG PO TABS: 10.0000 mg | ORAL_TABLET | Freq: Every day | ORAL | 5 refills | Status: DC

## 2020-04-09 NOTE — Assessment & Plan Note (Signed)
Stable AST/ALT: 50/40 Normal ALP Repeat in 71months

## 2020-04-10 MED ORDER — METFORMIN HCL 500 MG PO TABS: 500.0000 mg | ORAL_TABLET | Freq: Every day | ORAL | 1 refills | Status: DC

## 2020-04-10 NOTE — Assessment & Plan Note (Signed)
HgbA1c of 6.3 Start metformin 500mg  daily

## 2020-04-10 NOTE — Addendum Note (Signed)
Addended by: Leana Gamer on: 04/10/2020 03:43 PM   Modules accepted: Orders

## 2020-04-13 ENCOUNTER — Telehealth: Payer: Self-pay

## 2020-04-13 NOTE — Telephone Encounter (Signed)
Pt called stating she is due to get her second covid vaccine today and yesterday she started to develop coughing, nasal drainage, and congestion. Pt would like to know if she should wait on getting her second vaccine? Informed pt per Baldo Ash she should wait on getting second injection since this is a new onset of symptoms and she should go get tested. Once the symptoms have improved and she is feeling better she should get her vaccine. Pt verbalized understanding.

## 2020-06-15 ENCOUNTER — Other Ambulatory Visit: Payer: Self-pay

## 2020-06-15 NOTE — Telephone Encounter (Signed)
Pt requesting a refill on xanax, pt states she has not had it filled in a while because she has not needed it but she is now out.

## 2020-06-15 NOTE — Telephone Encounter (Signed)
Needs to schedule video or [id number] appt

## 2020-07-01 ENCOUNTER — Other Ambulatory Visit: Payer: Self-pay | Admitting: Nurse Practitioner

## 2020-07-01 DIAGNOSIS — I10 Essential (primary) hypertension: Secondary | ICD-10-CM

## 2020-07-01 DIAGNOSIS — K219 Gastro-esophageal reflux disease without esophagitis: Secondary | ICD-10-CM

## 2020-07-02 ENCOUNTER — Other Ambulatory Visit: Payer: Self-pay

## 2020-07-03 ENCOUNTER — Encounter: Payer: Self-pay | Admitting: Nurse Practitioner

## 2020-07-03 ENCOUNTER — Ambulatory Visit: Payer: 59 | Admitting: Nurse Practitioner

## 2020-07-03 VITALS — BP 118/74 | HR 99 | Temp 98.2°F | Ht 63.0 in | Wt 204.8 lb

## 2020-07-03 DIAGNOSIS — D751 Secondary polycythemia: Secondary | ICD-10-CM | POA: Diagnosis not present

## 2020-07-03 DIAGNOSIS — R718 Other abnormality of red blood cells: Secondary | ICD-10-CM

## 2020-07-03 DIAGNOSIS — R7989 Other specified abnormal findings of blood chemistry: Secondary | ICD-10-CM | POA: Diagnosis not present

## 2020-07-03 DIAGNOSIS — F411 Generalized anxiety disorder: Secondary | ICD-10-CM | POA: Diagnosis not present

## 2020-07-03 DIAGNOSIS — D582 Other hemoglobinopathies: Secondary | ICD-10-CM

## 2020-07-03 MED ORDER — ALPRAZOLAM 0.25 MG PO TABS: 0.2500 mg | ORAL_TABLET | Freq: Every day | ORAL | 0 refills | Status: DC | PRN

## 2020-07-03 NOTE — Progress Notes (Addendum)
Subjective:  Patient ID: [name], female    DOB: [birth year]  Age: 59 y.o. MRN: [id number]  CC: Medication Refill (Pt in need of medication refill (xanax). Declined flu vaccine. )  HPI  GAD (generalized anxiety disorder) Chronic, waxing and waning. Use of alprazolam 0.25mg  half tab daily prn when she feels overwhelmed. Last filled 05/04/2019, #60tabs by previous pcp. She other medication used in the past. She denies need for daily medication and psychology referral.  Alprazolam Rx sent, #30  Elevated LFTs Admits to ETOH use 8-12oz cans of beer per week. No ABD pain or nausea or weight loss. Gallbladder still present.  Negative Hep. B and C Normal B12 and folate Persistent mild elevation in AST and ALT Need to completely all ETOH and acetaminophen use Elevated iron. Stop all iron supplements if taking. Need to check for a gene mutation (hemochromatosis) which can cause high iron level and high hgb. Schedule another lab appt. Advised to quit ETOH and acetaminophen consumption.  Depression screen [facility] 2/9 07/03/2020 01/11/2016  Decreased Interest 1 0  Down, Depressed, Hopeless 0 0  PHQ - 2 Score 1 0  Altered sleeping 1 -  Tired, decreased energy 1 -  Change in appetite 0 -  Feeling bad or failure about yourself  0 -  Trouble concentrating 0 -  Moving slowly or fidgety/restless 0 -  Suicidal thoughts 0 -  PHQ-9 Score 3 -  Difficult doing work/chores Not difficult at all -   GAD 7 : Generalized Anxiety Score 07/03/2020  Nervous, Anxious, on Edge 0  Control/stop worrying 1  Worry too much - different things 0  Trouble relaxing 1  Restless 0  Easily annoyed or irritable 1  Afraid - awful might happen 0  Total GAD 7 Score 3  Anxiety Difficulty Somewhat difficult   Reviewed past Medical, Social and Family history today.  Outpatient Medications Prior to Visit  Medication Sig Dispense Refill  . ezetimibe (ZETIA) 10 MG tablet Take 1 tablet (10 mg total) by mouth  daily. 30 tablet 5  . lansoprazole (PREVACID) 30 MG capsule Take 1 capsule (30 mg total) by mouth daily at 12 noon. 90 capsule 1  . lisinopril-hydrochlorothiazide (ZESTORETIC) 20-12.5 MG tablet Take 1 tablet by mouth daily. 90 tablet 3  . Loratadine (CLARITIN PO) Take 10 mg by mouth daily.     . metFORMIN (GLUCOPHAGE) 500 MG tablet Take 1 tablet (500 mg total) by mouth daily with breakfast. 90 tablet 1  . omega-3 acid ethyl esters (LOVAZA) 1 g capsule Take 1 capsule (1 g total) by mouth 2 (two) times daily. 60 capsule 5  . ALPRAZolam (XANAX) 0.25 MG tablet Take 0.25 mg by mouth 2 (two) times daily as needed.      No facility-administered medications prior to visit.    ROS See HPI  Objective:  BP 118/74 (BP Location: Left Arm, Patient Position: Sitting, Cuff Size: Large)   Pulse 99   Temp 98.2 F (36.8 C) (Temporal)   Ht 5\' 3"  (1.6 m)   Wt 204 lb 12.8 oz (92.9 kg)   SpO2 98%   BMI 36.28 kg/m   Physical Exam Vitals reviewed.  Constitutional:      Appearance: She is obese.  Cardiovascular:     Rate and Rhythm: Normal rate.     Pulses: Normal pulses.  Pulmonary:     Effort: Pulmonary effort is normal.  Abdominal:     General: Bowel sounds are normal.     Palpations:  Abdomen is soft.     Tenderness: There is no abdominal tenderness. There is no guarding.  Neurological:     Mental Status: She is alert and oriented to person, place, and time.  Psychiatric:        Mood and Affect: Mood normal.        Behavior: Behavior normal.        Thought Content: Thought content normal.    Assessment & Plan:  This visit occurred during the SARS-CoV-2 public health emergency.  Safety protocols were in place, including screening questions prior to the visit, additional usage of staff PPE, and extensive cleaning of exam room while observing appropriate contact time as indicated for disinfecting solutions.   [name] was seen today for medication refill.  Diagnoses and all orders for this  visit:  GAD (generalized anxiety disorder) -     ALPRAZolam (XANAX) 0.25 MG tablet; Take 1 tablet (0.25 mg total) by mouth daily as needed.  Elevated LFTs -     Hepatic function panel -     Hepatitis panel, acute -     Hemochromatosis DNA-PCR(c282y,h63d); Future  Polycythemia -     CBC with Differential/Platelet -     B12 and Folate Panel -     Iron, TIBC and Ferritin Panel  Elevated ferritin, hemoglobin, and red blood cell count (HCC) -     Hemochromatosis DNA-PCR(c282y,h63d); Future   Problem List Items Addressed This Visit      Other   Elevated LFTs    Admits to ETOH use 8-12oz cans of beer per week. No ABD pain or nausea or weight loss. Gallbladder still present.  Negative Hep. B and C Normal B12 and folate Persistent mild elevation in AST and ALT Need to completely all ETOH and acetaminophen use Elevated iron. Stop all iron supplements if taking. Need to check for a gene mutation (hemochromatosis) which can cause high iron level and high hgb. Schedule another lab appt. Advised to quit ETOH and acetaminophen consumption.      Relevant Orders   Hepatic function panel (Completed)   Hepatitis panel, acute (Completed)   Hemochromatosis DNA-PCR(c282y,h63d)   GAD (generalized anxiety disorder) - Primary    Chronic, waxing and waning. Use of alprazolam 0.25mg  half tab daily prn when she feels overwhelmed. Last filled 05/04/2019, #60tabs by previous pcp. She other medication used in the past. She denies need for daily medication and psychology referral.  Alprazolam Rx sent, #30      Relevant Medications   ALPRAZolam (XANAX) 0.25 MG tablet    Other Visit Diagnoses    Polycythemia       Relevant Orders   CBC with Differential/Platelet (Completed)   B12 and Folate Panel (Completed)   Iron, TIBC and Ferritin Panel (Completed)   Elevated ferritin, hemoglobin, and red blood cell count (HCC)       Relevant Orders   Hemochromatosis DNA-PCR(c282y,h63d)       Follow-up: Return in about 3 months (around 10/01/2020) for prediabetes, Anxiety and , hyperlipidemia.  Wilfred Lacy, NP

## 2020-07-03 NOTE — Patient Instructions (Addendum)
Stop ETOH and acetaminophen use  Go to lab for blood draw.

## 2020-07-03 NOTE — Assessment & Plan Note (Addendum)
Admits to ETOH use 8-12oz cans of beer per week. No ABD pain or nausea or weight loss. Gallbladder still present.  Negative Hep. B and C Normal B12 and folate Persistent mild elevation in AST and ALT Need to completely all ETOH and acetaminophen use Elevated iron. Stop all iron supplements if taking. Need to check for a gene mutation (hemochromatosis) which can cause high iron level and high hgb. Schedule another lab appt. Advised to quit ETOH and acetaminophen consumption.

## 2020-07-03 NOTE — Assessment & Plan Note (Signed)
Chronic, waxing and waning. Use of alprazolam 0.25mg  half tab daily prn when she feels overwhelmed. Last filled 05/04/2019, #60tabs by previous pcp. She other medication used in the past. She denies need for daily medication and psychology referral.  Alprazolam Rx sent, #30

## 2020-07-04 LAB — CBC WITH DIFFERENTIAL/PLATELET
Basophils Absolute: 0 10*3/uL (ref 0.0–0.1)
Basophils Relative: 0.3 % (ref 0.0–3.0)
Eosinophils Absolute: 0.1 10*3/uL (ref 0.0–0.7)
Eosinophils Relative: 1.3 % (ref 0.0–5.0)
HCT: 45.9 % (ref 36.0–46.0)
Hemoglobin: 15.6 g/dL — ABNORMAL HIGH (ref 12.0–15.0)
Lymphocytes Relative: 47.3 % — ABNORMAL HIGH (ref 12.0–46.0)
Lymphs Abs: 4.4 10*3/uL — ABNORMAL HIGH (ref 0.7–4.0)
MCHC: 34 g/dL (ref 30.0–36.0)
MCV: 97.9 fl (ref 78.0–100.0)
Monocytes Absolute: 0.8 10*3/uL (ref 0.1–1.0)
Monocytes Relative: 8.6 % (ref 3.0–12.0)
Neutro Abs: 3.9 10*3/uL (ref 1.4–7.7)
Neutrophils Relative %: 42.5 % — ABNORMAL LOW (ref 43.0–77.0)
Platelets: 249 10*3/uL (ref 150.0–400.0)
RBC: 4.69 Mil/uL (ref 3.87–5.11)
RDW: 13.3 % (ref 11.5–15.5)
WBC: 9.2 10*3/uL (ref 4.0–10.5)

## 2020-07-04 LAB — HEPATITIS PANEL, ACUTE
Hep A IgM: NONREACTIVE
Hep B C IgM: NONREACTIVE
Hepatitis B Surface Ag: NONREACTIVE
Hepatitis C Ab: NONREACTIVE
SIGNAL TO CUT-OFF: 0.19 (ref <1.00)

## 2020-07-04 LAB — HEPATIC FUNCTION PANEL
ALT: 40 U/L — ABNORMAL HIGH (ref 0–35)
AST: 48 U/L — ABNORMAL HIGH (ref 0–37)
Albumin: 4 g/dL (ref 3.5–5.2)
Alkaline Phosphatase: 109 U/L (ref 39–117)
Bilirubin, Direct: 0.1 mg/dL (ref 0.0–0.3)
Total Bilirubin: 0.4 mg/dL (ref 0.2–1.2)
Total Protein: 8.3 g/dL (ref 6.0–8.3)

## 2020-07-04 LAB — B12 AND FOLATE PANEL
Folate: 16.4 ng/mL (ref >5.9)
Vitamin B-12: 277 pg/mL (ref 211–911)

## 2020-07-04 LAB — IRON,TIBC AND FERRITIN PANEL
%SAT: 48 % (calc) — ABNORMAL HIGH (ref 16–45)
Ferritin: 179 ng/mL (ref 16–232)
Iron: 170 ug/dL — ABNORMAL HIGH (ref 45–160)
TIBC: 357 mcg/dL (calc) (ref 250–450)

## 2020-07-06 NOTE — Addendum Note (Signed)
Addended by: Leana Gamer on: 07/06/2020 03:28 PM   Modules accepted: Orders

## 2020-07-09 ENCOUNTER — Other Ambulatory Visit: Payer: Self-pay

## 2020-07-10 ENCOUNTER — Other Ambulatory Visit (INDEPENDENT_AMBULATORY_CARE_PROVIDER_SITE_OTHER): Payer: 59

## 2020-07-10 DIAGNOSIS — R7989 Other specified abnormal findings of blood chemistry: Secondary | ICD-10-CM | POA: Diagnosis not present

## 2020-07-10 DIAGNOSIS — D582 Other hemoglobinopathies: Secondary | ICD-10-CM

## 2020-07-19 LAB — HEMOCHROMATOSIS DNA-PCR(C282Y,H63D)

## 2020-07-26 NOTE — Addendum Note (Signed)
Addended by: Alysia Penna L on: 07/26/2020 10:09 AM   Modules accepted: Orders

## 2020-07-26 NOTE — Assessment & Plan Note (Signed)
Elevated LFT, hgb, and iron. Positive H63D mutation Entered referral to hematology

## 2020-07-30 ENCOUNTER — Telehealth: Payer: Self-pay | Admitting: Oncology

## 2020-07-30 NOTE — Telephone Encounter (Signed)
Received a new hem referral from [facility] for hemochromatosis. Pt has been cld and scheduled to see Dr. Clelia Croft on 1/12 at 11am. Pt aware to arrive 30 minutes early.

## 2020-08-04 ENCOUNTER — Other Ambulatory Visit: Payer: Self-pay | Admitting: Nurse Practitioner

## 2020-08-04 DIAGNOSIS — K219 Gastro-esophageal reflux disease without esophagitis: Secondary | ICD-10-CM

## 2020-08-06 NOTE — Telephone Encounter (Deleted)
Medication request for Lansoprazole 30 mg, chart supports Rx. Pt last ov 07/03/2020

## 2020-08-08 ENCOUNTER — Other Ambulatory Visit: Payer: Self-pay

## 2020-08-08 ENCOUNTER — Inpatient Hospital Stay: Payer: 59 | Attending: Oncology | Admitting: Oncology

## 2020-08-08 DIAGNOSIS — R7401 Elevation of levels of liver transaminase levels: Secondary | ICD-10-CM | POA: Diagnosis not present

## 2020-08-08 DIAGNOSIS — I10 Essential (primary) hypertension: Secondary | ICD-10-CM | POA: Diagnosis not present

## 2020-08-08 DIAGNOSIS — F17210 Nicotine dependence, cigarettes, uncomplicated: Secondary | ICD-10-CM | POA: Diagnosis not present

## 2020-08-08 DIAGNOSIS — Z7984 Long term (current) use of oral hypoglycemic drugs: Secondary | ICD-10-CM | POA: Insufficient documentation

## 2020-08-08 DIAGNOSIS — R7989 Other specified abnormal findings of blood chemistry: Secondary | ICD-10-CM | POA: Insufficient documentation

## 2020-08-08 DIAGNOSIS — Z79899 Other long term (current) drug therapy: Secondary | ICD-10-CM | POA: Insufficient documentation

## 2020-08-08 NOTE — Progress Notes (Signed)
Reason for the request:    Hemochromatosis  HPI: I was asked by Wilfred Lacy, NP to evaluate [name] for the evaluation of elevated iron studies.  She is a 60 year old woman with history of hypertension and allergic rhinitis was found to have elevated transaminases and iron levels.  Laboratory data on April 06, 2020 showed a AST of 50 and ALT of 40 and repeat testing on December 7 showed AST 48 and ALT of 40.  She has normal bilirubin.  Iron level was noted to be elevated at 170 with saturation 48% ferritin is 179.  Her hemoglobin is 15.6 with a hematocrit of 45.  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  She reports drinking alcohol regularly predominantly beer on a daily basis.  She denies any liquor or wine use or excessive use in general.  She has reported decrease in her intake recently.  She does report a family history of hemochromatosis which predominantly her mother has undergone phlebotomy in the past.  She does not report any complaints at this time.  She does report some anxiety associated with taking care of her father.   She does not report any headaches, blurry vision, syncope or seizures. Does not report any fevers, chills or sweats.  Does not report any cough, wheezing or hemoptysis.  Does not report any chest pain, palpitation, orthopnea or leg edema.  Does not report any nausea, vomiting or abdominal pain.  Does not report any constipation or diarrhea.  Does not report any skeletal complaints.    Does not report frequency, urgency or hematuria.  Does not report any skin rashes or lesions. Does not report any heat or cold intolerance.  Does not report any lymphadenopathy or petechiae.  Does not report any anxiety or depression.  Remaining review of systems is negative.    Past Medical History:  Diagnosis Date  . Allergic rhinitis   . Hypertension   :  Past Surgical History:  Procedure  Laterality Date  . APPENDECTOMY    . EXPLORATORY LAPAROTOMY  1981  :   Current Outpatient Medications:  .  ALPRAZolam (XANAX) 0.25 MG tablet, Take 1 tablet (0.25 mg total) by mouth daily as needed., Disp: 30 tablet, Rfl: 0 .  ezetimibe (ZETIA) 10 MG tablet, Take 1 tablet (10 mg total) by mouth daily., Disp: 30 tablet, Rfl: 5 .  lansoprazole (PREVACID) 30 MG capsule, Take 1 capsule (30 mg total) by mouth daily at 12 noon., Disp: 90 capsule, Rfl: 1 .  lisinopril-hydrochlorothiazide (ZESTORETIC) 20-12.5 MG tablet, Take 1 tablet by mouth daily., Disp: 90 tablet, Rfl: 3 .  Loratadine (CLARITIN PO), Take 10 mg by mouth daily. , Disp: , Rfl:  .  metFORMIN (GLUCOPHAGE) 500 MG tablet, Take 1 tablet (500 mg total) by mouth daily with breakfast., Disp: 90 tablet, Rfl: 1 .  omega-3 acid ethyl esters (LOVAZA) 1 g capsule, Take 1 capsule (1 g total) by mouth 2 (two) times daily., Disp: 60 capsule, Rfl: 5:  Allergies  Allergen Reactions  . Statins Other (See Comments)    Leg pain and weakness  :  Family History  Problem Relation Age of Onset  . Colon polyps Mother   . Colon cancer Neg Hx   . Stomach cancer Neg Hx   . Rectal cancer Neg Hx   . Esophageal cancer Neg Hx   :  Social History   Socioeconomic History  . Marital status: Single  Spouse name: Not on file  . Number of children: Not on file  . Years of education: Not on file  . Highest education level: Not on file  Occupational History  . Not on file  Tobacco Use  . Smoking status: Current Every Day Smoker    Packs/day: 1.00    Types: Cigarettes  . Smokeless tobacco: Current User  Substance and Sexual Activity  . Alcohol use: Yes  . Drug use: No  . Sexual activity: Not on file  Other Topics Concern  . Not on file  Social History Narrative  . Not on file   Social Determinants of Health   Financial Resource Strain: Not on file  Food Insecurity: Not on file  Transportation Needs: Not on file  Physical Activity: Not on  file  Stress: Not on file  Social Connections: Not on file  Intimate Partner Violence: Not on file  :  Pertinent items are noted in HPI.  Exam: Blood pressure (!) 155/85, pulse 99, temperature 98.1 F (36.7 C), temperature source Tympanic, resp. rate 18, height 5\' 3"  (1.6 m), weight 203 lb 11.2 oz (92.4 kg), SpO2 97 %.  ECOG 0  General appearance: alert and cooperative appeared without distress. Head: atraumatic without any abnormalities. Eyes: conjunctivae/corneas clear. PERRL.  Sclera anicteric. Throat: lips, mucosa, and tongue normal; without oral thrush or ulcers. Resp: clear to auscultation bilaterally without rhonchi, wheezes or dullness to percussion. Cardio: regular rate and rhythm, S1, S2 normal, no murmur, click, rub or gallop GI: soft, non-tender; bowel sounds normal; no masses,  no organomegaly Skin: Skin color, texture, turgor normal. No rashes or lesions Lymph nodes: Cervical, supraclavicular, and axillary nodes normal. Neurologic: Grossly normal without any motor, sensory or deep tendon reflexes. Musculoskeletal: No joint deformity or effusion.    Assessment and Plan:    60 year old woman with:  1.  Elevated iron level with an increase saturation.  This is in the setting of a heterozygous H63D mutation and mild elevation in her LFTs.  The natural course of this disease and treatment options were discussed at this time.  She appears to be more of a carrier rather than a true iron overload phenotype.  Her elevation in iron studies and liver function test can be explained by her alcohol intake rather than severe hemochromatosis.  Treatment options were discussed at this time which includes recurrent phlebotomy versus occasional blood donation.  Given her mild elevation in her iron and levels and LFTs that recommended blood donation at this time I continued monitoring of her liver function tests.  I recommended blood donation once to twice a year and will repeat iron  studies in 6 months.  2.  Elevated transaminases: Likely related to alcohol intake rather than iron infiltration.  I recommended decreasing alcohol intake at this time.  3.  Follow-up: Will be in 6 months for repeat evaluation.   45  minutes were dedicated to this visit. The time was spent on reviewing laboratory data, discussing treatment options, discussing differential diagnosis and answering questions regarding future plan.    A copy of this consult has been forwarded to the requesting provider

## 2020-08-09 NOTE — Telephone Encounter (Signed)
Last OV 07/03/20 Last fill 01/03/20  #90/1

## 2020-09-10 ENCOUNTER — Other Ambulatory Visit: Payer: Self-pay | Admitting: Nurse Practitioner

## 2020-09-10 DIAGNOSIS — F411 Generalized anxiety disorder: Secondary | ICD-10-CM

## 2020-09-20 ENCOUNTER — Other Ambulatory Visit: Payer: Self-pay

## 2020-09-21 ENCOUNTER — Encounter: Payer: Self-pay | Admitting: Nurse Practitioner

## 2020-09-21 ENCOUNTER — Ambulatory Visit: Payer: 59 | Admitting: Nurse Practitioner

## 2020-09-21 VITALS — BP 134/80 | HR 104 | Temp 97.6°F | Wt 205.0 lb

## 2020-09-21 DIAGNOSIS — F411 Generalized anxiety disorder: Secondary | ICD-10-CM | POA: Diagnosis not present

## 2020-09-21 DIAGNOSIS — Z634 Disappearance and death of family member: Secondary | ICD-10-CM | POA: Diagnosis not present

## 2020-09-21 DIAGNOSIS — F4321 Adjustment disorder with depressed mood: Secondary | ICD-10-CM | POA: Diagnosis not present

## 2020-09-21 NOTE — Assessment & Plan Note (Signed)
Increased with expected death of her nephew 10days ago. She has used alprazolam half tab daily for last 1week. Denies need for grief counseling. States she is relaying on family support Denies any suicidal ideation. No need for medication refill at this time. F/up in 60months

## 2020-09-21 NOTE — Progress Notes (Signed)
Subjective:  Patient ID: [name], female    DOB: 07-Aug-1960  Age: 60 y.o. MRN: [id number]  CC: Acute Visit (Pt is here to discuss anxiety management, pt states the last few weeks have been rough )  HPI  GAD (generalized anxiety disorder) Increased with expected death of her nephew 10days ago. She has used alprazolam half tab daily for last 1week. Denies need for grief counseling. States she is relaying on family support Denies any suicidal ideation. No need for medication refill at this time. F/up in 110months   Reviewed past Medical, Social and Family history today.  Outpatient Medications Prior to Visit  Medication Sig Dispense Refill  . ALPRAZolam (XANAX) 0.25 MG tablet Take 1 tablet (0.25 mg total) by mouth daily as needed. Will need office visit for additional refill 30 tablet 0  . ezetimibe (ZETIA) 10 MG tablet Take 1 tablet (10 mg total) by mouth daily. 30 tablet 5  . lansoprazole (PREVACID) 30 MG capsule TAKE 1 CAPSULE (30 MG TOTAL) BY MOUTH DAILY AT 12 NOON. 90 capsule 1  . lisinopril-hydrochlorothiazide (ZESTORETIC) 20-12.5 MG tablet Take 1 tablet by mouth daily. 90 tablet 3  . Loratadine (CLARITIN PO) Take 10 mg by mouth daily.    . metFORMIN (GLUCOPHAGE) 500 MG tablet Take 1 tablet (500 mg total) by mouth daily with breakfast. 90 tablet 1  . omega-3 acid ethyl esters (LOVAZA) 1 g capsule Take 1 capsule (1 g total) by mouth 2 (two) times daily. 60 capsule 5   No facility-administered medications prior to visit.    ROS See HPI  Objective:  BP 134/80 (BP Location: Left Arm, Patient Position: Sitting, Cuff Size: Normal)   Pulse (!) 104   Temp 97.6 F (36.4 C) (Temporal)   Wt 205 lb (93 kg)   SpO2 96%   BMI 36.31 kg/m   Physical Exam Vitals reviewed.  Neurological:     Mental Status: She is alert.  Psychiatric:        Attention and Perception: Attention normal.        Mood and Affect: Mood is depressed.        Speech: Speech normal.         Behavior: Behavior is cooperative.        Thought Content: Thought content normal.        Cognition and Memory: Cognition normal.        Judgment: Judgment normal.     Assessment & Plan:  This visit occurred during the SARS-CoV-2 public health emergency.  Safety protocols were in place, including screening questions prior to the visit, additional usage of staff PPE, and extensive cleaning of exam room while observing appropriate contact time as indicated for disinfecting solutions.   [name] was seen today for acute visit.  Diagnoses and all orders for this visit:  GAD (generalized anxiety disorder)  Grief at loss of child   Problem List Items Addressed This Visit      Other   GAD (generalized anxiety disorder) - Primary    Increased with expected death of her nephew 10days ago. She has used alprazolam half tab daily for last 1week. Denies need for grief counseling. States she is relaying on family support Denies any suicidal ideation. No need for medication refill at this time. F/up in 47months       Other Visit Diagnoses    Grief at loss of child          I have spent 64mins with this patient  regarding history taking, documentation, formulating plan and discussing treatment options with patient.  Follow-up: No follow-ups on file.  Wilfred Lacy, NP

## 2020-10-29 ENCOUNTER — Telehealth: Payer: Self-pay | Admitting: Nurse Practitioner

## 2020-10-29 DIAGNOSIS — I10 Essential (primary) hypertension: Secondary | ICD-10-CM

## 2020-10-29 NOTE — Telephone Encounter (Signed)
[facility] said [facility] told [facility] to call office. I advised Lisinopril/HCTZ 20/25 was d/c 03/2020 and pt was to start Lisinopril/HCTZ 20/12.5. Pt thinks the pharmacy kept filling the 20/25 but will check when she gets home and call back.

## 2020-10-30 ENCOUNTER — Other Ambulatory Visit: Payer: Self-pay

## 2020-10-30 DIAGNOSIS — I10 Essential (primary) hypertension: Secondary | ICD-10-CM

## 2020-10-30 MED ORDER — LISINOPRIL-HYDROCHLOROTHIAZIDE 20-12.5 MG PO TABS: 1.0000 | ORAL_TABLET | Freq: Every day | ORAL | 3 refills | Status: DC

## 2020-10-30 MED ORDER — LISINOPRIL-HYDROCHLOROTHIAZIDE 20-12.5 MG PO TABS: 1.0000 | ORAL_TABLET | Freq: Every day | ORAL | 1 refills | Status: DC

## 2020-10-30 NOTE — Telephone Encounter (Signed)
[facility] said [facility] her to call office. I advised Lisinopril/HCTZ 20/25 was d/c 03/2020 and pt was to start Lisinopril/HCTZ 20/12.5. Pt thinks the pharmacy kept filling the 20/25 but will check when she gets home and call back.   Rx sent in for 20/12.5

## 2020-10-30 NOTE — Telephone Encounter (Signed)
Pt checking on status of this message. Please advise 

## 2020-10-30 NOTE — Addendum Note (Signed)
Addended by: Wilfred Lacy L on: 10/30/2020 12:31 PM   Modules accepted: Orders

## 2021-01-30 ENCOUNTER — Other Ambulatory Visit: Payer: Self-pay | Admitting: Nurse Practitioner

## 2021-01-30 DIAGNOSIS — K219 Gastro-esophageal reflux disease without esophagitis: Secondary | ICD-10-CM

## 2021-02-05 ENCOUNTER — Other Ambulatory Visit: Payer: 59

## 2021-02-12 ENCOUNTER — Telehealth: Payer: Self-pay | Admitting: *Deleted

## 2021-02-12 ENCOUNTER — Ambulatory Visit: Payer: 59 | Admitting: Oncology

## 2021-02-12 ENCOUNTER — Telehealth: Payer: Self-pay | Admitting: Oncology

## 2021-02-12 NOTE — Telephone Encounter (Signed)
R/s appts per 7/19 sch msg. Called pt, no answer. Left msg with appts dates and times.

## 2021-02-12 NOTE — Telephone Encounter (Signed)
PC to patient regarding missed appointment this morning, she states she was unaware.  Informed her scheduling will reschedule new appointment & notify her.  She verbalizes understanding, scheduling message sent.

## 2021-02-19 ENCOUNTER — Other Ambulatory Visit: Payer: Self-pay | Admitting: Nurse Practitioner

## 2021-02-19 DIAGNOSIS — Z1231 Encounter for screening mammogram for malignant neoplasm of breast: Secondary | ICD-10-CM

## 2021-02-23 ENCOUNTER — Ambulatory Visit
Admission: RE | Admit: 2021-02-23 | Discharge: 2021-02-23 | Disposition: A | Discharge status: Home / Self Care | Payer: 59 | Source: Ambulatory Visit | Attending: Nurse Practitioner | Admitting: Nurse Practitioner

## 2021-02-23 DIAGNOSIS — Z1231 Encounter for screening mammogram for malignant neoplasm of breast: Secondary | ICD-10-CM

## 2021-02-26 ENCOUNTER — Inpatient Hospital Stay: Payer: 59 | Attending: Oncology

## 2021-02-26 ENCOUNTER — Other Ambulatory Visit: Payer: Self-pay

## 2021-02-26 DIAGNOSIS — R945 Abnormal results of liver function studies: Secondary | ICD-10-CM | POA: Diagnosis not present

## 2021-02-26 DIAGNOSIS — R7401 Elevation of levels of liver transaminase levels: Secondary | ICD-10-CM | POA: Diagnosis not present

## 2021-02-26 LAB — CMP (CANCER CENTER ONLY)
ALT: 68 U/L — ABNORMAL HIGH (ref 0–44)
AST: 70 U/L — ABNORMAL HIGH (ref 15–41)
Albumin: 3.5 g/dL (ref 3.5–5.0)
Alkaline Phosphatase: 141 U/L — ABNORMAL HIGH (ref 38–126)
Anion gap: 6 (ref 5–15)
BUN: 15 mg/dL (ref 6–20)
CO2: 30 mmol/L (ref 22–32)
Calcium: 9.4 mg/dL (ref 8.9–10.3)
Chloride: 103 mmol/L (ref 98–111)
Creatinine: 0.81 mg/dL (ref 0.44–1.00)
GFR, Estimated: >60 mL/min (ref >60)
Glucose, Bld: 121 mg/dL — ABNORMAL HIGH (ref 70–99)
Potassium: 4 mmol/L (ref 3.5–5.1)
Sodium: 139 mmol/L (ref 135–145)
Total Bilirubin: 0.4 mg/dL (ref 0.3–1.2)
Total Protein: 8 g/dL (ref 6.5–8.1)

## 2021-02-26 LAB — CBC WITH DIFFERENTIAL (CANCER CENTER ONLY)
Abs Immature Granulocytes: 0.01 10*3/uL (ref 0.00–0.07)
Basophils Absolute: 0.1 10*3/uL (ref 0.0–0.1)
Basophils Relative: 1 %
Eosinophils Absolute: 0.1 10*3/uL (ref 0.0–0.5)
Eosinophils Relative: 2 %
HCT: 46.2 % — ABNORMAL HIGH (ref 36.0–46.0)
Hemoglobin: 15.4 g/dL — ABNORMAL HIGH (ref 12.0–15.0)
Immature Granulocytes: 0 %
Lymphocytes Relative: 50 %
Lymphs Abs: 3.6 10*3/uL (ref 0.7–4.0)
MCH: 33.4 pg (ref 26.0–34.0)
MCHC: 33.3 g/dL (ref 30.0–36.0)
MCV: 100.2 fL — ABNORMAL HIGH (ref 80.0–100.0)
Monocytes Absolute: 0.7 10*3/uL (ref 0.1–1.0)
Monocytes Relative: 9 %
Neutro Abs: 2.8 10*3/uL (ref 1.7–7.7)
Neutrophils Relative %: 38 %
Platelet Count: 232 10*3/uL (ref 150–400)
RBC: 4.61 MIL/uL (ref 3.87–5.11)
RDW: 12.5 % (ref 11.5–15.5)
WBC Count: 7.2 10*3/uL (ref 4.0–10.5)
nRBC: 0 % (ref 0.0–0.2)

## 2021-02-26 LAB — IRON AND TIBC
Iron: 108 ug/dL (ref 41–142)
Saturation Ratios: 33 % (ref 21–57)
TIBC: 324 ug/dL (ref 236–444)
UIBC: 216 ug/dL (ref 120–384)

## 2021-02-27 LAB — FERRITIN: Ferritin: 296 ng/mL (ref 11–307)

## 2021-03-05 ENCOUNTER — Other Ambulatory Visit: Payer: Self-pay

## 2021-03-05 ENCOUNTER — Inpatient Hospital Stay (HOSPITAL_BASED_OUTPATIENT_CLINIC_OR_DEPARTMENT_OTHER): Payer: 59 | Admitting: Oncology

## 2021-03-05 NOTE — Progress Notes (Signed)
Hematology and Oncology Follow Up Visit  [name] [id number] Aug 27, 1960 60 y.o. 03/05/2021 8:11 AM Nche, Charlene Brooke, NPNche, Charlene Brooke, NP   Principle Diagnosis: 60 year old with hemochromatosis presented with a heterozygous H63D mutation in January 2022.  He presented with mild elevation in her iron studies as well as elevation of liver function test.     Current therapy: Periodic blood donation.  Interim History: Ms. Emge returns today for a follow-up visit.  Since her last visit, she reports no major changes in her health.  She continues to be active and attends to activities of daily living.  She denies any hospitalizations or illnesses.  Her performance status and quality of life remained excellent.  She continues to drink heavily predominantly every day 2-3 drinks although she has intentions to decrease her intake.  He has not had any blood donation recently.     Medications: I have reviewed the patient's current medications.  Current Outpatient Medications  Medication Sig Dispense Refill   ALPRAZolam (XANAX) 0.25 MG tablet Take 1 tablet (0.25 mg total) by mouth daily as needed. Will need office visit for additional refill 30 tablet 0   ezetimibe (ZETIA) 10 MG tablet Take 1 tablet (10 mg total) by mouth daily. 30 tablet 5   lansoprazole (PREVACID) 30 MG capsule TAKE 1 CAPSULE (30 MG TOTAL) BY MOUTH DAILY AT 12 NOON. 90 capsule 0   lisinopril-hydrochlorothiazide (ZESTORETIC) 20-12.5 MG tablet Take 1 tablet by mouth daily. Need appt for additional refills 90 tablet 1   Loratadine (CLARITIN PO) Take 10 mg by mouth daily.     metFORMIN (GLUCOPHAGE) 500 MG tablet Take 1 tablet (500 mg total) by mouth daily with breakfast. 90 tablet 1   omega-3 acid ethyl esters (LOVAZA) 1 g capsule Take 1 capsule (1 g total) by mouth 2 (two) times daily. 60 capsule 5   No current facility-administered medications for this visit.     Allergies:  Allergies  Allergen Reactions    Statins Other (See Comments)    Leg pain and weakness     Physical Exam: Blood pressure 134/76, pulse 87, temperature 98.3 F (36.8 C), temperature source Oral, resp. rate 18, height '5\' 3"'$  (1.6 m), weight 204 lb 14.4 oz (92.9 kg), SpO2 97 %.  ECOG: 0    General appearance: Comfortable appearing without any discomfort Head: Normocephalic without any trauma Oropharynx: Mucous membranes are moist and pink without any thrush or ulcers. Eyes: Pupils are equal and round reactive to light. Lymph nodes: No cervical, supraclavicular, inguinal or axillary lymphadenopathy.   Heart:regular rate and rhythm.  S1 and S2 without leg edema. Lung: Clear without any rhonchi or wheezes.  No dullness to percussion. Abdomin: Soft, nontender, nondistended with good bowel sounds.  No hepatosplenomegaly. Musculoskeletal: No joint deformity or effusion.  Full range of motion noted. Neurological: No deficits noted on motor, sensory and deep tendon reflex exam. Skin: No petechial rash or dryness.  Appeared moist.       Lab Results: Lab Results  Component Value Date   WBC 7.2 02/26/2021   HGB 15.4 (H) 02/26/2021   HCT 46.2 (H) 02/26/2021   MCV 100.2 (H) 02/26/2021   PLT 232 02/26/2021     Chemistry      Component Value Date/Time   NA 139 02/26/2021 0855   NA 136 (A) 11/07/2019 0000   K 4.0 02/26/2021 0855   CL 103 02/26/2021 0855   CO2 30 02/26/2021 0855   BUN 15 02/26/2021 0855  BUN 16 11/07/2019 0000   CREATININE 0.81 02/26/2021 0855   GLU 110 11/07/2019 0000      Component Value Date/Time   CALCIUM 9.4 02/26/2021 0855   ALKPHOS 141 (H) 02/26/2021 0855   AST 70 (H) 02/26/2021 0855   ALT 68 (H) 02/26/2021 0855   BILITOT 0.4 02/26/2021 0855         Impression and Plan:   60 year old woman with:   1.  Hemochromatosis with heterozygous H63D mutation and mild elevation in her LFTs noticed in January 2022.   Laboratory data from February 26, 2021 were personally reviewed and  discussed with the patient today.  Iron studies appears to close to normal range with iron level of 108 saturation of 33% and ferritin of 296.  Her hemoglobin is mildly elevated at 15.4.  She still has elevation in her LFTs however.  Management options moving forward were reviewed at this time.  I see no indication for any additional treatment or hemochromatosis given the mild elevation in her iron studies.  The manifestation of this mutation usually rather mild and clinically insignificant.  Periodic blood donation is recommended however rather than phlebotomy.   2.  Elevated transaminases: Does not appear to be related to hemochromatosis.  Other etiologies such as alcohol intake hepatitis among other considerations are also a possibility.  I recommended following with a primary care provider regarding this issue.   3.  Follow-up: I am happy to see her in the future as needed.   30  minutes were spent on this encounter.  The time was dedicated to reviewing laboratory data, treatment choices and outlining complications related to therapy.Zola Button, MD 8/9/20228:11 AM

## 2021-04-23 ENCOUNTER — Other Ambulatory Visit: Payer: Self-pay | Admitting: Nurse Practitioner

## 2021-04-23 DIAGNOSIS — K219 Gastro-esophageal reflux disease without esophagitis: Secondary | ICD-10-CM

## 2021-05-28 ENCOUNTER — Telehealth: Payer: Self-pay | Admitting: Nurse Practitioner

## 2021-05-28 DIAGNOSIS — K219 Gastro-esophageal reflux disease without esophagitis: Secondary | ICD-10-CM

## 2021-05-28 NOTE — Telephone Encounter (Signed)
What is the name of the medication? lansoprazole (PREVACID) 30 MG capsule [[id number]] and ALPRAZolam (XANAX) 0.25 MG tablet [[id number]]   Have you contacted your pharmacy to request a refill? She is needing refills on these scripts. She does have an upcoming appointment for a cpe with [facility] on 06/28/21.  Which pharmacy would you like this sent to? [id number] [address], [place] [address]  Phone:  [phone]  Fax:  [phone]  DEA #:  [id number]   Patient notified that their request is being sent to the clinical staff for review and that they should receive a call once it is complete. If they do not receive a call within 72 hours they can check with their pharmacy or our office.

## 2021-05-29 MED ORDER — LANSOPRAZOLE 30 MG PO CPDR: 30.0000 mg | DELAYED_RELEASE_CAPSULE | Freq: Every day | ORAL | 0 refills | Status: DC

## 2021-05-29 NOTE — Telephone Encounter (Signed)
Prevacid refill sent I will not be able to provide any additional alprazolam refill before upcoming appt in December.

## 2021-05-29 NOTE — Telephone Encounter (Signed)
Patient notified and verbalized understanding. 

## 2021-06-27 NOTE — Telephone Encounter (Signed)
rx refilled 05/29/2021

## 2021-06-28 ENCOUNTER — Other Ambulatory Visit: Payer: Self-pay

## 2021-06-28 ENCOUNTER — Encounter: Payer: Self-pay | Admitting: Nurse Practitioner

## 2021-06-28 ENCOUNTER — Ambulatory Visit (INDEPENDENT_AMBULATORY_CARE_PROVIDER_SITE_OTHER): Payer: 59 | Admitting: Nurse Practitioner

## 2021-06-28 VITALS — BP 124/72 | HR 99 | Temp 97.5°F | Ht 63.0 in | Wt 203.8 lb

## 2021-06-28 DIAGNOSIS — R7303 Prediabetes: Secondary | ICD-10-CM | POA: Diagnosis not present

## 2021-06-28 DIAGNOSIS — I10 Essential (primary) hypertension: Secondary | ICD-10-CM | POA: Diagnosis not present

## 2021-06-28 DIAGNOSIS — Z23 Encounter for immunization: Secondary | ICD-10-CM

## 2021-06-28 DIAGNOSIS — E785 Hyperlipidemia, unspecified: Secondary | ICD-10-CM

## 2021-06-28 DIAGNOSIS — F411 Generalized anxiety disorder: Secondary | ICD-10-CM

## 2021-06-28 DIAGNOSIS — Z0001 Encounter for general adult medical examination with abnormal findings: Secondary | ICD-10-CM

## 2021-06-28 DIAGNOSIS — Z720 Tobacco use: Secondary | ICD-10-CM

## 2021-06-28 MED ORDER — LISINOPRIL-HYDROCHLOROTHIAZIDE 20-12.5 MG PO TABS: 1.0000 | ORAL_TABLET | Freq: Every day | ORAL | 3 refills | Status: DC

## 2021-06-28 MED ORDER — ALPRAZOLAM 0.25 MG PO TABS: 0.2500 mg | ORAL_TABLET | Freq: Every day | ORAL | 0 refills | Status: DC | PRN

## 2021-06-28 NOTE — Assessment & Plan Note (Signed)
Reports she had changed diet to low fat/low carb. No exercise, but stays active at work (walking and standing) Has decreased tobacco use to 1/2ppd. Has decreased ETOH use to 1-2cans of beer per week.  Repeat lipid panel and CMP

## 2021-06-28 NOTE — Assessment & Plan Note (Addendum)
Unable to tolerate metformin 500mg  (caused severe diarrhea) Repeat hgbA1c

## 2021-06-28 NOTE — Progress Notes (Signed)
Subjective:    Patient ID: [name], female    DOB: April 01, 1961, 60 y.o.   MRN: [id number]  Patient presents today for CPE and eval of chronic conditions  HPI GAD (generalized anxiety disorder) Improved mood Use of alprazolam sparingly, half tab 1-2x/week. PHQ9- 1 GAD7- 2 PMPM database reviewed: no red flags, alprazolam last filled 10/2020(#30) Alprazolam refill sent  Hyperlipidemia with target LDL less than 100 Reports she had changed diet to low fat/low carb. No exercise, but stays active at work (walking and standing) Has decreased tobacco use to 1/2ppd. Has decreased ETOH use to 1-2cans of beer per week.  Repeat lipid panel and CMP  Prediabetes Unable to tolerate metformin 500mg  (caused severe diarrhea) Repeat hgbA1c  HTN (hypertension) BP at goal with lisinopril/HCTZ BP Readings from Last 3 Encounters:  06/28/21 124/72  03/05/21 134/76  09/21/20 134/80   Repeat CMP Maintain current med dose  Tobacco use Cutting down Currently smokes 1/2ppd Declined to use nicoderm or gum or chantix of zyban.  Encourage to continue cutting down with the goal to completely discontinue use. She verbalized understanding  Vision: will schedule Dental:will schedule Diet:low fat and low carb Exercise:none Weight:  Wt Readings from Last 3 Encounters:  06/28/21 203 lb 12.8 oz (92.4 kg)  03/05/21 204 lb 14.4 oz (92.9 kg)  09/21/20 205 lb (93 kg)   Sexual History (orientation,birth control, marital status, STD):not sexually active, no need for STD screen, no uterus present. Up to date with mammogram and colonoscopy  Depression/Suicide: Depression screen [facility] 2/9 06/28/2021 06/28/2021 09/21/2020 07/03/2020 01/11/2016  Decreased Interest 0 0 0 1 0  Down, Depressed, Hopeless 0 0 0 0 0  PHQ - 2 Score 0 0 0 1 0  Altered sleeping 0 - 0 1 -  Tired, decreased energy 0 - 1 1 -  Change in appetite 0 - 0 0 -  Feeling bad or failure about yourself  0 - 0 0 -  Trouble  concentrating 0 - 0 0 -  Moving slowly or fidgety/restless 1 - 0 0 -  Suicidal thoughts 0 - 0 0 -  PHQ-9 Score 1 - 1 3 -  Difficult doing work/chores Not difficult at all - Not difficult at all Not difficult at all -   GAD 7 : Generalized Anxiety Score 06/28/2021 09/21/2020 07/03/2020  Nervous, Anxious, on Edge 1 1 0  Control/stop worrying 0 0 1  Worry too much - different things 0 0 0  Trouble relaxing 0 1 1  Restless 1 1 0  Easily annoyed or irritable 0 1 1  Afraid - awful might happen 0 1 0  Total GAD 7 Score 2 5 3   Anxiety Difficulty Not difficult at all Not difficult at all Somewhat difficult   Immunizations: (TDAP, Hep C screen, Pneumovax, Influenza, zoster)  Health Maintenance  Topic Date Due   Pneumococcal Vaccination (1 - PCV) Never done   Zoster (Shingles) Vaccine (1 of 2) Never done   Tetanus Vaccine  01/16/2018   HIV Screening  06/28/2022*   Mammogram  02/24/2023   Colon Cancer Screening  01/04/2030   Flu Shot  Completed   Hepatitis C Screening: USPSTF Recommendation to screen - Ages 18-79 yo.  Completed   HPV Vaccine  Aged Out   Pap Smear  Discontinued   COVID-19 Vaccine  Discontinued  *Topic was postponed. The date shown is not the original due date.   Fall Risk: Fall Risk  06/28/2021 09/21/2020 07/03/2020 01/11/2016  Falls in  the past year? 0 0 0 No  Number falls in past yr: 0 0 0 -  Injury with Fall? - 0 0 -   Medications and allergies reviewed with patient and updated if appropriate.  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 No additional refill without office visit 90 capsule 0   lisinopril-hydrochlorothiazide (ZESTORETIC) 20-12.5 MG tablet Take 1 tablet by mouth daily. Need appt for additional refills 90 tablet 1   Loratadine (CLARITIN PO) Take 10 mg by mouth daily.     ezetimibe (ZETIA) 10 MG tablet Take 1 tablet (10 mg total) by mouth daily. (Patient not taking: Reported on 06/28/2021) 30 tablet 5   omega-3 acid ethyl esters (LOVAZA) 1 g capsule Take 1 capsule (1 g total) by mouth 2 (two) times daily. (Patient not taking: Reported on 06/28/2021) 60 capsule 5   No current facility-administered medications on file prior to visit.    Past Medical History:  Diagnosis Date   Allergic rhinitis    Hypertension     Past Surgical History:  Procedure Laterality Date   APPENDECTOMY     EXPLORATORY LAPAROTOMY  1981    Social History   Socioeconomic History   Marital status: Single    Spouse name: Not on file   Number of children: Not on file   Years of education: Not on file   Highest education level: Not on file  Occupational History   Not on file  Tobacco Use   Smoking status: Every Day    Packs/day: 0.50    Years: 15.00    Pack years: 7.50    Types: Cigarettes   Smokeless tobacco: Current  Substance and Sexual Activity   Alcohol use: Yes    Alcohol/week: 2.0 standard drinks    Types: 2 Cans of beer per week   Drug use: No   Sexual activity: Not Currently    Birth control/protection: Abstinence  Other Topics Concern   Not on file  Social History Narrative   Not on file   Social Determinants of Health   Financial Resource Strain: Not on file  Food Insecurity: Not on file  Transportation Needs: Not on file  Physical Activity: Not on file  Stress: Not on file  Social Connections: Not on file    Family History  Problem Relation Age of Onset   Colon polyps  Mother    Breast cancer Sister 61   Breast cancer Maternal Aunt    Colon cancer Neg Hx    Stomach cancer Neg Hx    Rectal cancer Neg Hx    Esophageal cancer Neg Hx        Review of Systems  Constitutional:  Negative for fever, malaise/fatigue and weight loss.  HENT:  Negative for congestion and sore throat.   Eyes:        Negative for visual changes  Respiratory:  Negative for cough and shortness of breath.   Cardiovascular:  Negative  for chest pain, palpitations and leg swelling.  Gastrointestinal:  Negative for blood in stool, constipation, diarrhea and heartburn.  Genitourinary:  Negative for dysuria, frequency and urgency.  Musculoskeletal:  Negative for falls, joint pain and myalgias.  Skin:  Negative for rash.  Neurological:  Negative for dizziness, tingling, sensory change, focal weakness and headaches.  Endo/Heme/Allergies:  Does not bruise/bleed easily.  Psychiatric/Behavioral:  Negative for depression, substance abuse and suicidal ideas. The patient is not nervous/anxious and does not have insomnia.    Objective:   Vitals:   06/28/21 1310  BP: 124/72  Pulse: 99  Temp: (!) 97.5 F (36.4 C)  SpO2: 96%   Body mass index is 36.1 kg/m.  Physical Examination:  Physical Exam Vitals reviewed.  Constitutional:      General: She is not in acute distress. HENT:     Right Ear: Tympanic membrane, ear canal and external ear normal.     Left Ear: Tympanic membrane, ear canal and external ear normal.  Eyes:     Extraocular Movements: Extraocular movements intact.     Conjunctiva/sclera: Conjunctivae normal.  Cardiovascular:     Rate and Rhythm: Normal rate and regular rhythm.     Pulses: Normal pulses.     Heart sounds: Normal heart sounds.  Pulmonary:     Effort: Pulmonary effort is normal.     Breath sounds: Normal breath sounds.  Chest:  Breasts:    Breasts are symmetrical.     Right: Normal.     Left: Normal.  Abdominal:     General: Bowel sounds are  normal.     Palpations: Abdomen is soft.  Genitourinary:    Comments: declined Musculoskeletal:        General: Normal range of motion.     Cervical back: Normal range of motion and neck supple.     Right lower leg: No edema.     Left lower leg: No edema.  Lymphadenopathy:     Cervical: No cervical adenopathy.     Upper Body:     Right upper body: No supraclavicular, axillary or pectoral adenopathy.     Left upper body: No supraclavicular, axillary or pectoral adenopathy.  Skin:    General: Skin is warm and dry.     Findings: No erythema.  Neurological:     Mental Status: She is alert and oriented to person, place, and time.  Psychiatric:        Mood and Affect: Mood normal.        Behavior: Behavior normal.        Thought Content: Thought content normal.   ASSESSMENT and PLAN: This visit occurred during the SARS-CoV-2 public health emergency.  Safety protocols were in place, including screening questions prior to the visit, additional usage of staff PPE, and extensive cleaning of exam room while observing appropriate contact time as indicated for disinfecting solutions.   [name] was seen today for annual exam.  Diagnoses and all orders for this visit:  Encounter for preventative adult health care exam with abnormal findings -     Comprehensive metabolic panel -     CBC with Differential/Platelet -     TSH  GAD (generalized anxiety disorder) -     ALPRAZolam (XANAX) 0.25 MG tablet; Take 1 tablet (0.25 mg total) by mouth daily as needed.  Need for influenza vaccination -     Flu Vaccine QUAD 6+ mos PF IM (Fluarix Quad PF)  Prediabetes -     Hemoglobin A1c  Hyperlipidemia with target LDL less than 100 -     Lipid panel  Primary hypertension  Tobacco use    Maintain heart healthy diet and daily exercise (cardio 15-41mins and strength training 15-60mins). Schedule appt for annual eye exam and dental cleaning. Schedule nurse visit for pneumovax 20 and  shingrix.  Problem List Items Addressed This Visit       Cardiovascular and Mediastinum   HTN (hypertension)    BP at goal with lisinopril/HCTZ BP Readings from Last 3 Encounters:  06/28/21 124/72  03/05/21 134/76  09/21/20 134/80   Repeat CMP Maintain current med dose        Other   GAD (generalized anxiety disorder)    Improved mood Use of alprazolam sparingly, half tab 1-2x/week. PHQ9- 1 GAD7- 2 PMPM database reviewed: no red flags, alprazolam last filled 10/2020(#30) Alprazolam refill sent      Relevant Medications   ALPRAZolam (XANAX) 0.25 MG tablet   Hyperlipidemia with target LDL less than 100    Reports she had changed diet to low fat/low carb. No exercise, but stays active at work (walking and standing) Has decreased tobacco use to 1/2ppd. Has decreased ETOH use to 1-2cans of beer per week.  Repeat lipid panel and CMP      Relevant Orders   Lipid panel   Prediabetes    Unable to tolerate metformin 500mg  (caused severe diarrhea) Repeat hgbA1c      Relevant Orders   Hemoglobin A1c   Tobacco use    Cutting down Currently smokes 1/2ppd Declined to use nicoderm or gum or chantix of zyban.  Encourage to continue cutting down with the goal to completely discontinue use. She verbalized understanding      Other Visit Diagnoses     Encounter for preventative adult health care exam with abnormal findings    -  Primary   Relevant Orders   Comprehensive metabolic panel   CBC with Differential/Platelet   TSH   Need for influenza vaccination       Relevant Orders   Flu Vaccine QUAD 6+ mos PF IM (Fluarix Quad PF) (Completed)       Follow up: Return in about 6 months (around 12/27/2021) for DM and HTN, hyperlipidemia (fasting).  Wilfred Lacy, NP

## 2021-06-28 NOTE — Patient Instructions (Addendum)
Schedule nurse visit for pneumovax 20 and shingrix.  Go to lab for blood draw  Maintain heart healthy diet and daily exercise (cardio 15-1mins and strength training 15-5mins)  Heart-Healthy Eating Plan Many factors influence your heart (coronary) health, including eating and exercise habits. Coronary risk increases with abnormal blood fat (lipid) levels. Heart-healthy meal planning includes limiting unhealthy fats, increasing healthy fats, and making other diet and lifestyle changes. What is my plan? Your health care provider may recommend that you: Limit your fat intake to _________% or less of your total calories each day. Limit your saturated fat intake to _________% or less of your total calories each day. Limit the amount of cholesterol in your diet to less than _________ mg per day. What are tips for following this plan? Cooking Cook foods using methods other than frying. Baking, boiling, grilling, and broiling are all good options. Other ways to reduce fat include: Removing the skin from poultry. Removing all visible fats from meats. Steaming vegetables in water or broth. Meal planning  At meals, imagine dividing your plate into fourths: Fill one-half of your plate with vegetables and green salads. Fill one-fourth of your plate with whole grains. Fill one-fourth of your plate with lean protein foods. Eat 4-5 servings of vegetables per day. One serving equals 1 cup raw or cooked vegetable, or 2 cups raw leafy greens. Eat 4-5 servings of fruit per day. One serving equals 1 medium whole fruit,  cup dried fruit,  cup fresh, frozen, or canned fruit, or  cup 100% fruit juice. Eat more foods that contain soluble fiber. Examples include apples, broccoli, carrots, beans, peas, and barley. Aim to get 25-30 g of fiber per day. Increase your consumption of legumes, nuts, and seeds to 4-5 servings per week. One serving of dried beans or legumes equals  cup cooked, 1 serving of nuts is   cup, and 1 serving of seeds equals 1 tablespoon. Fats Choose healthy fats more often. Choose monounsaturated and polyunsaturated fats, such as olive and canola oils, flaxseeds, walnuts, almonds, and seeds. Eat more omega-3 fats. Choose salmon, mackerel, sardines, tuna, flaxseed oil, and ground flaxseeds. Aim to eat fish at least 2 times each week. Check food labels carefully to identify foods with trans fats or high amounts of saturated fat. Limit saturated fats. These are found in animal products, such as meats, butter, and cream. Plant sources of saturated fats include palm oil, palm kernel oil, and coconut oil. Avoid foods with partially hydrogenated oils in them. These contain trans fats. Examples are stick margarine, some tub margarines, cookies, crackers, and other baked goods. Avoid fried foods. General information Eat more home-cooked food and less restaurant, buffet, and fast food. Limit or avoid alcohol. Limit foods that are high in starch and sugar. Lose weight if you are overweight. Losing just 5-10% of your body weight can help your overall health and prevent diseases such as diabetes and heart disease. Monitor your salt (sodium) intake, especially if you have high blood pressure. Talk with your health care provider about your sodium intake. Try to incorporate more vegetarian meals weekly. What foods can I eat? Fruits All fresh, canned (in natural juice), or frozen fruits. Vegetables Fresh or frozen vegetables (raw, steamed, roasted, or grilled). Green salads. Grains Most grains. Choose whole wheat and whole grains most of the time. Rice and pasta, including brown rice and pastas made with whole wheat. Meats and other proteins Lean, well-trimmed beef, veal, pork, and lamb. Chicken and [place] without skin. All  fish and shellfish. Wild duck, rabbit, pheasant, and venison. Egg whites or low-cholesterol egg substitutes. Dried beans, peas, lentils, and tofu. Seeds and most  nuts. Dairy Low-fat or nonfat cheeses, including ricotta and mozzarella. Skim or 1% milk (liquid, powdered, or evaporated). Buttermilk made with low-fat milk. Nonfat or low-fat yogurt. Fats and oils Non-hydrogenated (trans-free) margarines. Vegetable oils, including soybean, sesame, sunflower, olive, peanut, safflower, corn, canola, and cottonseed. Salad dressings or mayonnaise made with a vegetable oil. Beverages Water (mineral or sparkling). Coffee and tea. Diet carbonated beverages. Sweets and desserts Sherbet, gelatin, and fruit ice. Small amounts of dark chocolate. Limit all sweets and desserts. Seasonings and condiments All seasonings and condiments. The items listed above may not be a complete list of foods and beverages you can eat. Contact a dietitian for more options. What foods are not recommended? Fruits Canned fruit in heavy syrup. Fruit in cream or butter sauce. Fried fruit. Limit coconut. Vegetables Vegetables cooked in cheese, cream, or butter sauce. Fried vegetables. Grains Breads made with saturated or trans fats, oils, or whole milk. Croissants. Sweet rolls. Donuts. High-fat crackers, such as cheese crackers. Meats and other proteins Fatty meats, such as hot dogs, ribs, sausage, bacon, rib-eye roast or steak. High-fat deli meats, such as salami and bologna. Caviar. Domestic duck and goose. Organ meats, such as liver. Dairy Cream, sour cream, cream cheese, and creamed cottage cheese. Whole-milk cheeses. Whole or 2% milk (liquid, evaporated, or condensed). Whole buttermilk. Cream sauce or high-fat cheese sauce. Whole-milk yogurt. Fats and oils Meat fat, or shortening. Cocoa butter, hydrogenated oils, palm oil, coconut oil, palm kernel oil. Solid fats and shortenings, including bacon fat, salt pork, lard, and butter. Nondairy cream substitutes. Salad dressings with cheese or sour cream. Beverages Regular sodas and any drinks with added sugar. Sweets and desserts Frosting.  Pudding. Cookies. Cakes. Pies. Milk chocolate or white chocolate. Buttered syrups. Full-fat ice cream or ice cream drinks. The items listed above may not be a complete list of foods and beverages to avoid. Contact a dietitian for more information. Summary Heart-healthy meal planning includes limiting unhealthy fats, increasing healthy fats, and making other diet and lifestyle changes. Lose weight if you are overweight. Losing just 5-10% of your body weight can help your overall health and prevent diseases such as diabetes and heart disease. Focus on eating a balance of foods, including fruits and vegetables, low-fat or nonfat dairy, lean protein, nuts and legumes, whole grains, and heart-healthy oils and fats. This information is not intended to replace advice given to you by your health care provider. Make sure you discuss any questions you have with your health care provider. Document Revised: 11/22/2020 Document Reviewed: 11/22/2020 Elsevier Patient Education  2022 [employer].

## 2021-06-28 NOTE — Assessment & Plan Note (Signed)
BP at goal with lisinopril/HCTZ BP Readings from Last 3 Encounters:  06/28/21 124/72  03/05/21 134/76  09/21/20 134/80   Repeat CMP Maintain current med dose

## 2021-06-28 NOTE — Assessment & Plan Note (Signed)
Cutting down Currently smokes 1/2ppd Declined to use nicoderm or gum or chantix of zyban.  Encourage to continue cutting down with the goal to completely discontinue use. She verbalized understanding

## 2021-06-28 NOTE — Assessment & Plan Note (Addendum)
Improved mood Use of alprazolam sparingly, half tab 1-2x/week. PHQ9- 1 GAD7- 2 PMPM database reviewed: no red flags, alprazolam last filled 10/2020(#30) Alprazolam refill sent

## 2021-06-29 LAB — CBC WITH DIFFERENTIAL/PLATELET
Absolute Monocytes: 581 cells/uL (ref 200–950)
Basophils Absolute: 58 cells/uL (ref 0–200)
Basophils Relative: 0.7 %
Eosinophils Absolute: 133 cells/uL (ref 15–500)
Eosinophils Relative: 1.6 %
HCT: 43.9 % (ref 35.0–45.0)
Hemoglobin: 15.5 g/dL (ref 11.7–15.5)
Lymphs Abs: 4291 cells/uL — ABNORMAL HIGH (ref 850–3900)
MCH: 33.8 pg — ABNORMAL HIGH (ref 27.0–33.0)
MCHC: 35.3 g/dL (ref 32.0–36.0)
MCV: 95.6 fL (ref 80.0–100.0)
MPV: 11.1 fL (ref 7.5–12.5)
Monocytes Relative: 7 %
Neutro Abs: 3237 cells/uL (ref 1500–7800)
Neutrophils Relative %: 39 %
Platelets: 228 10*3/uL (ref 140–400)
RBC: 4.59 10*6/uL (ref 3.80–5.10)
RDW: 11.8 % (ref 11.0–15.0)
Total Lymphocyte: 51.7 %
WBC: 8.3 10*3/uL (ref 3.8–10.8)

## 2021-06-29 LAB — TSH: TSH: 2.17 mIU/L (ref 0.40–4.50)

## 2021-06-29 LAB — COMPREHENSIVE METABOLIC PANEL
AG Ratio: 0.9 (calc) — ABNORMAL LOW (ref 1.0–2.5)
ALT: 52 U/L — ABNORMAL HIGH (ref 6–29)
AST: 61 U/L — ABNORMAL HIGH (ref 10–35)
Albumin: 3.9 g/dL (ref 3.6–5.1)
Alkaline phosphatase (APISO): 98 U/L (ref 37–153)
BUN: 11 mg/dL (ref 7–25)
CO2: 26 mmol/L (ref 20–32)
Calcium: 9.4 mg/dL (ref 8.6–10.4)
Chloride: 100 mmol/L (ref 98–110)
Creat: 0.79 mg/dL (ref 0.50–1.03)
Globulin: 4.2 g/dL (calc) — ABNORMAL HIGH (ref 1.9–3.7)
Glucose, Bld: 85 mg/dL (ref 65–99)
Potassium: 4.2 mmol/L (ref 3.5–5.3)
Sodium: 136 mmol/L (ref 135–146)
Total Bilirubin: 0.7 mg/dL (ref 0.2–1.2)
Total Protein: 8.1 g/dL (ref 6.1–8.1)

## 2021-06-29 LAB — SPECIMEN COMPROMISED

## 2021-06-29 LAB — HEMOGLOBIN A1C
Hgb A1c MFr Bld: 5.8 % of total Hgb — ABNORMAL HIGH (ref <5.7)
Mean Plasma Glucose: 120 mg/dL
eAG (mmol/L): 6.6 mmol/L

## 2021-06-29 LAB — LIPID PANEL
Cholesterol: 210 mg/dL — ABNORMAL HIGH (ref <200)
HDL: 38 mg/dL — ABNORMAL LOW (ref >=50)
LDL Cholesterol (Calc): 136 mg/dL (calc) — ABNORMAL HIGH
Non-HDL Cholesterol (Calc): 172 mg/dL (calc) — ABNORMAL HIGH (ref <130)
Total CHOL/HDL Ratio: 5.5 (calc) — ABNORMAL HIGH (ref <5.0)
Triglycerides: 223 mg/dL — ABNORMAL HIGH (ref <150)

## 2021-07-02 ENCOUNTER — Telehealth: Payer: Self-pay | Admitting: Gastroenterology

## 2021-07-02 ENCOUNTER — Telehealth: Payer: Self-pay | Admitting: Physician Assistant

## 2021-07-02 ENCOUNTER — Other Ambulatory Visit: Payer: Self-pay

## 2021-07-02 DIAGNOSIS — R1084 Generalized abdominal pain: Secondary | ICD-10-CM

## 2021-07-02 MED ORDER — DOCUSATE SODIUM 100 MG PO CAPS: 100.0000 mg | ORAL_CAPSULE | Freq: Every day | ORAL | 0 refills | Status: DC

## 2021-07-02 MED ORDER — POLYETHYLENE GLYCOL 3350 17 GM/SCOOP PO POWD
ORAL | 3 refills | Status: DC
Start: 2021-07-02 — End: 2021-07-09

## 2021-07-02 MED ORDER — METAMUCIL 4 IN 1 FIBER 51.7 % PO PACK: PACK | ORAL | 0 refills | Status: DC

## 2021-07-02 NOTE — Telephone Encounter (Signed)
Last OV with Nicoletta Ba, PA 11/22/19. Underwent colonoscopy with Dr. Tarri Glenn 01/05/20 with polyp removal. Recommended repeat colon in 7 yrs.  Called pt to inquire further about her symptoms. States she developed increased upper abd pain and bloating on 12/5. States the abd pain became so intense that she developed nausea with vomiting. Denies fever but did feel "hot and sweaty". Further added she felt weak and dizzy during the episode. States she immediately went to the [facility] because she felt an intense urge to defecate but was not able to fully eliminate her bowels. States she had 2 soft stools without odor or discoloration. States she had eaten oyster stew earlier in the day but had been having these symptoms throughout the day. Today, pt claims she feels better but was concerned about this sudden episode. Further adds these episodes have been occurring off and on for the past 3 yrs which is why she came to our office in 2021. When asked about her normal bowel habits, pt states she typically has 2 soft stools without foul odors or discoloration daily. Further states that she never feels like she fully evacuates her bowels when she defecates. Pt also denies maintaining proper fluid intake. States, "I may drink 2-3 16 ounce bottles of water everyday". Reviewed MAR. Does not appear pt takes any medications to aid in proper defecation of her bowels.   Advised the following:  Ensure pt drink 64 ounces or more of fluids per day  Take 1 stool softener daily Dissolve 2 capfuls of Miralax in 8 ounces of water and drink daily Take 1 dose of fiber per day. Since she is not used to taking fiber, may need to titrate dose until she is able to tolerate full dose If symptoms persist or worsen, proceed directly to ED for further evaluation Scheduled for f/u appt with Alonza Bogus, PA on 12/15 @ 920am for further evaluation Will route this message to Dr. Tarri Glenn to determine if any further orders or diagnostic testing  required prior to appt.

## 2021-07-02 NOTE — Telephone Encounter (Signed)
error 

## 2021-07-02 NOTE — Telephone Encounter (Signed)
Called pt and advised Dr. Tarri Glenn did review her concerns and does not have any further recommendations at this time. Verbalized acceptance and understanding.

## 2021-07-04 MED ORDER — FENOFIBRATE 145 MG PO TABS: 145.0000 mg | ORAL_TABLET | Freq: Every day | ORAL | 1 refills | Status: DC

## 2021-07-04 NOTE — Addendum Note (Signed)
Addended by: Wilfred Lacy L on: 07/04/2021 03:06 PM   Modules accepted: Orders

## 2021-07-09 ENCOUNTER — Ambulatory Visit (INDEPENDENT_AMBULATORY_CARE_PROVIDER_SITE_OTHER): Payer: 59 | Admitting: Physician Assistant

## 2021-07-09 ENCOUNTER — Encounter: Payer: Self-pay | Admitting: Physician Assistant

## 2021-07-09 VITALS — BP 134/76 | HR 108 | Ht 63.0 in | Wt 200.2 lb

## 2021-07-09 DIAGNOSIS — K219 Gastro-esophageal reflux disease without esophagitis: Secondary | ICD-10-CM

## 2021-07-09 DIAGNOSIS — R112 Nausea with vomiting, unspecified: Secondary | ICD-10-CM

## 2021-07-09 DIAGNOSIS — F1010 Alcohol abuse, uncomplicated: Secondary | ICD-10-CM | POA: Diagnosis not present

## 2021-07-09 DIAGNOSIS — R1084 Generalized abdominal pain: Secondary | ICD-10-CM | POA: Diagnosis not present

## 2021-07-09 NOTE — Progress Notes (Signed)
Chief Complaint: Bloating and abdominal pain  HPI:    [name] is a 60 year old female with a past medical history as listed below, known to Dr. Tarri Glenn, who presents to clinic today for follow-up of bloating and abdominal pain.    11/22/2019 office visit with Nicoletta Ba, PA-C.  At that time discussing intermittent episodes of abdominal pain and vomiting.  At that time discussed possible intermittent partial bowel obstructions given status post laparoscopic appendectomy in 2012.  At that time recommended a CT of the abdomen pelvis.  Also scheduled for colonoscopy.    01/05/2020 colonoscopy with diverticulosis in the sigmoid colon, three 1 mm polyps in the rectum, 1 3 mm polyp in the sigmoid colon, 1 1 mm polyp in the descending colon otherwise normal.  Pathology showed a mixture of hyperplastic polyps and tubular adenomas.  Repeat was recommended in 7 years.    07/02/2021 patient described increased abdominal pain and bloating.  Also some nausea and vomiting.  That time recommended to drink 64 ounces of fluids a day, take 1 stool softener daily and 2 capfuls of MiraLAX daily.  Also recommended a dose of fiber.  Told to proceed to the ER for further eval if symptoms continued.    Today, the patient tells me that she is continued with similar "episodes" of pain as to when she came and saw Amy last year.  Tells me she will have 2 episodes of the severe pain "spells", typically a year which started in September 2019.  She actually logs them in a journal and the 2 this year happened in September and then again in December.  Tells me that it typically starts in her epigastrium around 2 to 4:00 in the afternoon and she will get severe sharp pain rated as a 10/10 and then start with nausea and then vomiting eventually ensues around 1 or 2:00 in the morning and she will have liquid stools and feel then everything is watering, her eyes her nose etc., after she is able to vomit then she feels better and then  just lingers with generalized abdominal soreness over the next day day or 2 which then eventually goes away.  In between she seems to feel okay.  Today she is completely fine.     Does tell me though that she is drinking at least 6 beers a day and has done this for years and feels like it may be related to the alcohol.  Even in the midst of all this excruciating pain during the last episode she was still drinking.  She tells me "no alcohol is worth that pain".  She quit drinking 9 days ago.    Reflux is controlled with Lansoprazole 30 mg once a day.    Denies fever, chills, weight loss or blood in her stool.  Past Medical History:  Diagnosis Date   Allergic rhinitis    Hypertension     Past Surgical History:  Procedure Laterality Date   APPENDECTOMY     EXPLORATORY LAPAROTOMY  1981    Current Outpatient Medications  Medication Sig Dispense Refill   ALPRAZolam (XANAX) 0.25 MG tablet Take 1 tablet (0.25 mg total) by mouth daily as needed. 30 tablet 0   fenofibrate (TRICOR) 145 MG tablet Take 1 tablet (145 mg total) by mouth daily. 90 tablet 1   lansoprazole (PREVACID) 30 MG capsule Take 1 capsule (30 mg total) by mouth daily at 12 noon. No additional refill without office visit 90 capsule 0  lisinopril-hydrochlorothiazide (ZESTORETIC) 20-12.5 MG tablet Take 1 tablet by mouth daily. 90 tablet 3   Loratadine (CLARITIN PO) Take 10 mg by mouth daily.     No current facility-administered medications for this visit.    Allergies as of 07/09/2021 - Review Complete 07/09/2021  Allergen Reaction Noted   Statins Other (See Comments) 04/06/2020    Family History  Problem Relation Age of Onset   Colon polyps Mother    Breast cancer Sister 33   Breast cancer Maternal Aunt    Colon cancer Neg Hx    Stomach cancer Neg Hx    Rectal cancer Neg Hx    Esophageal cancer Neg Hx     Social History   Socioeconomic History   Marital status: Single    Spouse name: Not on file   Number of  children: Not on file   Years of education: Not on file   Highest education level: Not on file  Occupational History   Not on file  Tobacco Use   Smoking status: Every Day    Packs/day: 0.50    Years: 15.00    Pack years: 7.50    Types: Cigarettes   Smokeless tobacco: Current  Substance and Sexual Activity   Alcohol use: Yes    Alcohol/week: 2.0 standard drinks    Types: 2 Cans of beer per week   Drug use: No   Sexual activity: Not Currently    Birth control/protection: Abstinence  Other Topics Concern   Not on file  Social History Narrative   Not on file   Social Determinants of Health   Financial Resource Strain: Not on file  Food Insecurity: Not on file  Transportation Needs: Not on file  Physical Activity: Not on file  Stress: Not on file  Social Connections: Not on file  Intimate Partner Violence: Not on file    Review of Systems:    Constitutional: No weight loss, fever or chills Cardiovascular: No chest pain  Respiratory: No SOB  Gastrointestinal: See HPI and otherwise negative   Physical Exam:  Vital signs: BP 134/76    Pulse (!) 108    Ht 5\' 3"  (1.6 m)    Wt 200 lb 3.2 oz (90.8 kg)    BMI 35.46 kg/m    Constitutional:   Pleasant obese Caucasian female appears to be in NAD, Well developed, Well nourished, alert and cooperative Respiratory: Respirations even and unlabored. Lungs clear to auscultation bilaterally.   No wheezes, crackles, or rhonchi.  Cardiovascular: Normal S1, S2. No MRG. Regular rate and rhythm. No peripheral edema, cyanosis or pallor.  Gastrointestinal:  Soft, nondistended, nontender. No rebound or guarding. Normal bowel sounds. No appreciable masses or hepatomegaly. Rectal:  Not performed.  Psychiatric: Oriented to person, place and time. Demonstrates good judgement and reason without abnormal affect or behaviors.  RELEVANT LABS AND IMAGING: CBC    Component Value Date/Time   WBC 8.3 06/28/2021 1426   RBC 4.59 06/28/2021 1426   HGB  15.5 06/28/2021 1426   HGB 15.4 (H) 02/26/2021 0855   HCT 43.9 06/28/2021 1426   PLT 228 06/28/2021 1426   PLT 232 02/26/2021 0855   MCV 95.6 06/28/2021 1426   MCH 33.8 (H) 06/28/2021 1426   MCHC 35.3 06/28/2021 1426   RDW 11.8 06/28/2021 1426   LYMPHSABS 4,291 (H) 06/28/2021 1426   MONOABS 0.7 02/26/2021 0855   EOSABS 133 06/28/2021 1426   BASOSABS 58 06/28/2021 1426    CMP     Component Value  Date/Time   NA 136 06/28/2021 1426   NA 136 (A) 11/07/2019 0000   K 4.2 06/28/2021 1426   CL 100 06/28/2021 1426   CO2 26 06/28/2021 1426   GLUCOSE 85 06/28/2021 1426   BUN 11 06/28/2021 1426   BUN 16 11/07/2019 0000   CREATININE 0.79 06/28/2021 1426   CALCIUM 9.4 06/28/2021 1426   PROT 8.1 06/28/2021 1426   ALBUMIN 3.5 02/26/2021 0855   AST 61 (H) 06/28/2021 1426   AST 70 (H) 02/26/2021 0855   ALT 52 (H) 06/28/2021 1426   ALT 68 (H) 02/26/2021 0855   ALKPHOS 141 (H) 02/26/2021 0855   BILITOT 0.7 06/28/2021 1426   BILITOT 0.4 02/26/2021 0855   GFRNONAA >60 02/26/2021 0855   GFRAA 95 11/07/2019 0000    Assessment: 1.  Generalized abdominal pain: Comes in "spells", typically only twice a year, afterwards it is resolved; again consider intermittent bowel obstructions given history of surgeries in the past most likely versus relation to alcohol use/abuse 2.  Nausea and vomiting: With spells above 3.  GERD: Controlled on Lansoprazole 30 mg daily 4.  Alcohol abuse: At least 6 beers a day over the past many years, she quit drinking 9 days ago  Plan: 1.  Discussed with patient that stopping her alcohol use is a good decision.  Hopefully she can continue this.  Congratulated her on going 9 days.  Did discuss that her symptoms could be related to alcohol use including alcoholic gastritis, etc. 2.  Again encouraged the patient to call us during an episode, at that point would recommend a CT of the abdomen pelvis with contrast for further evaluation.  Patient tells me she thought about it  during this last episode but then just drank a beer instead. 3.  Continue Lansoprazole 30 mg daily 4.  Patient to follow in clinic with [place] as needed.  Ellouise Newer, PA-C [facility] 07/09/2021, 9:26 AM  Cc: Flossie Buffy, NP

## 2021-07-09 NOTE — Progress Notes (Signed)
Reviewed.  Dawon Troop L. Jara Feider, MD, MPH  

## 2021-07-09 NOTE — Patient Instructions (Signed)
If you are age 58 or older, your body mass index should be between 23-30. Your Body mass index is 35.46 kg/m. If this is out of the aforementioned range listed, please consider follow up with your Primary Care Provider.  If you are age 57 or younger, your body mass index should be between 19-25. Your Body mass index is 35.46 kg/m. If this is out of the aformentioned range listed, please consider follow up with your Primary Care Provider.   ________________________________________________________  The [facility] providers would like to encourage you to use [facility] to communicate with providers for non-urgent requests or questions.  Due to long hold times on the telephone, sending your provider a message by [facility] may be a faster and more efficient way to get a response.  Please allow 48 business hours for a response.  Please remember that this is for non-urgent requests.  _______________________________________________________

## 2021-07-11 ENCOUNTER — Ambulatory Visit: Payer: 59 | Admitting: Gastroenterology

## 2021-08-23 ENCOUNTER — Other Ambulatory Visit: Payer: Self-pay | Admitting: Nurse Practitioner

## 2021-08-23 DIAGNOSIS — K219 Gastro-esophageal reflux disease without esophagitis: Secondary | ICD-10-CM

## 2021-08-23 NOTE — Telephone Encounter (Signed)
Chart supports Rx Last seen 06/2021 Next OV 12/27/2021

## 2021-09-06 ENCOUNTER — Telehealth: Payer: Self-pay | Admitting: Nurse Practitioner

## 2021-09-06 DIAGNOSIS — R7989 Other specified abnormal findings of blood chemistry: Secondary | ICD-10-CM

## 2021-09-06 NOTE — Telephone Encounter (Signed)
Pt called in to schedule her lab appt but also wanted to know if she can get a liver panel, is this okay?

## 2021-09-06 NOTE — Telephone Encounter (Signed)
Pt aware.

## 2021-09-13 ENCOUNTER — Other Ambulatory Visit (INDEPENDENT_AMBULATORY_CARE_PROVIDER_SITE_OTHER): Payer: Self-pay

## 2021-09-13 ENCOUNTER — Other Ambulatory Visit: Payer: Self-pay

## 2021-09-13 DIAGNOSIS — R7989 Other specified abnormal findings of blood chemistry: Secondary | ICD-10-CM

## 2021-09-13 DIAGNOSIS — E785 Hyperlipidemia, unspecified: Secondary | ICD-10-CM

## 2021-09-13 DIAGNOSIS — R7303 Prediabetes: Secondary | ICD-10-CM

## 2021-09-13 LAB — HEPATIC FUNCTION PANEL
ALT: 18 U/L (ref 0–35)
AST: 28 U/L (ref 0–37)
Albumin: 4 g/dL (ref 3.5–5.2)
Alkaline Phosphatase: 100 U/L (ref 39–117)
Bilirubin, Direct: 0 mg/dL (ref 0.0–0.3)
Total Bilirubin: 0.4 mg/dL (ref 0.2–1.2)
Total Protein: 8 g/dL (ref 6.0–8.3)

## 2021-09-13 LAB — LIPID PANEL
Cholesterol: 181 mg/dL (ref 0–200)
HDL: 39.1 mg/dL (ref >39.00)
LDL Cholesterol: 122 mg/dL — ABNORMAL HIGH (ref 0–99)
NonHDL: 142.32
Total CHOL/HDL Ratio: 5
Triglycerides: 104 mg/dL (ref 0.0–149.0)
VLDL: 20.8 mg/dL (ref 0.0–40.0)

## 2021-09-13 LAB — HEMOGLOBIN A1C: Hgb A1c MFr Bld: 6.1 % (ref 4.6–6.5)

## 2021-10-17 IMAGING — MG MM DIGITAL SCREENING BILAT W/ TOMO AND CAD
8 series · 8 of 24 positions shown · non-contrast
Comparison: Previous exam(s).

CLINICAL DATA: Screening.

EXAM:
DIGITAL SCREENING BILATERAL MAMMOGRAM WITH TOMOSYNTHESIS AND CAD
TECHNIQUE: Bilateral screening digital craniocaudal and mediolateral oblique
mammograms were obtained. Bilateral screening digital breast
tomosynthesis was performed. The images were evaluated with
computer-aided detection.

[R CC synth-2D]
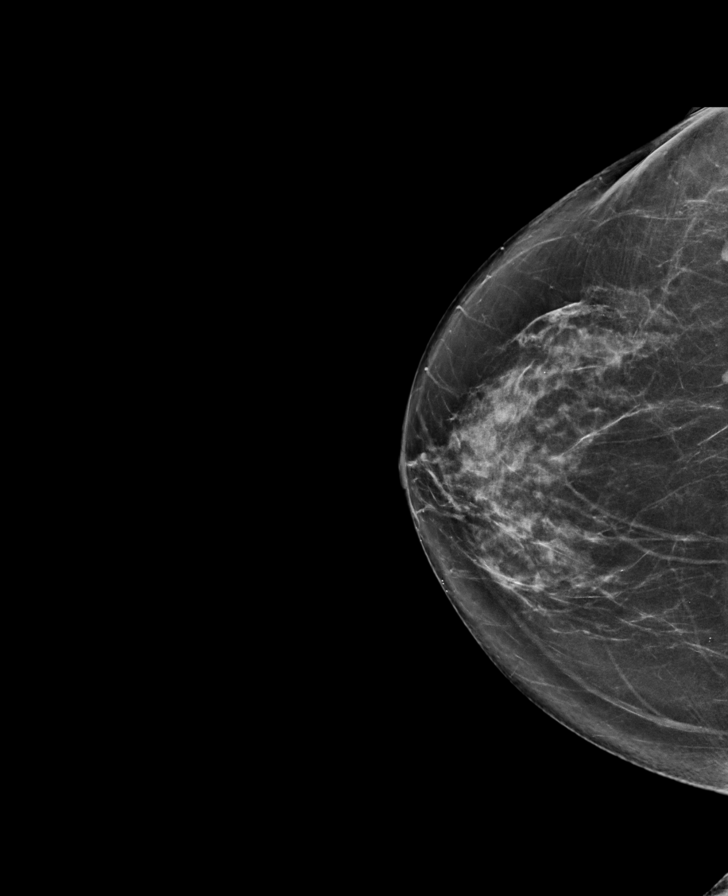

[L MLO synth-2D]
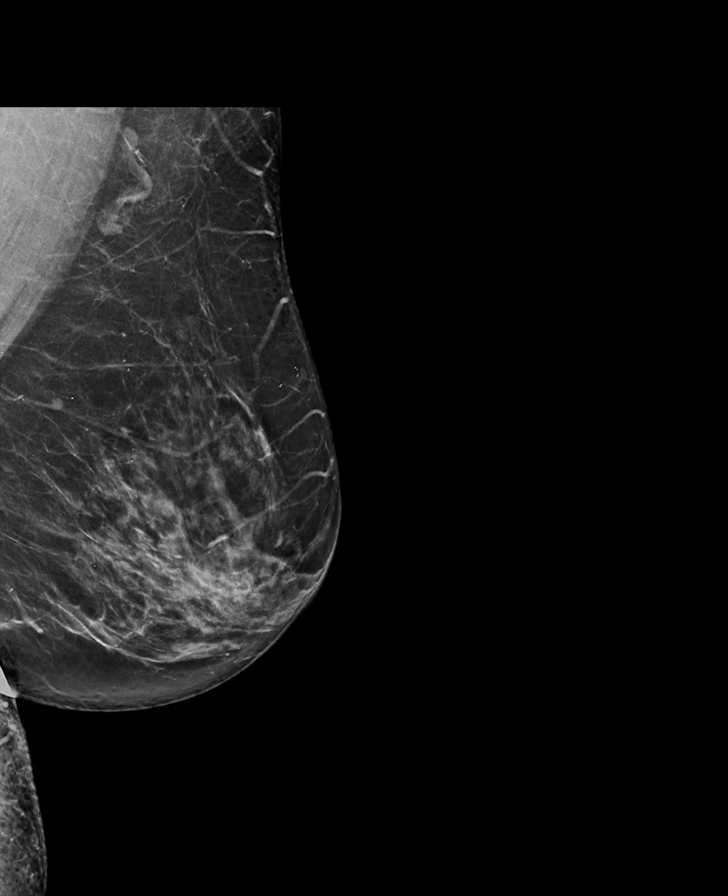

[L CC synth-2D]
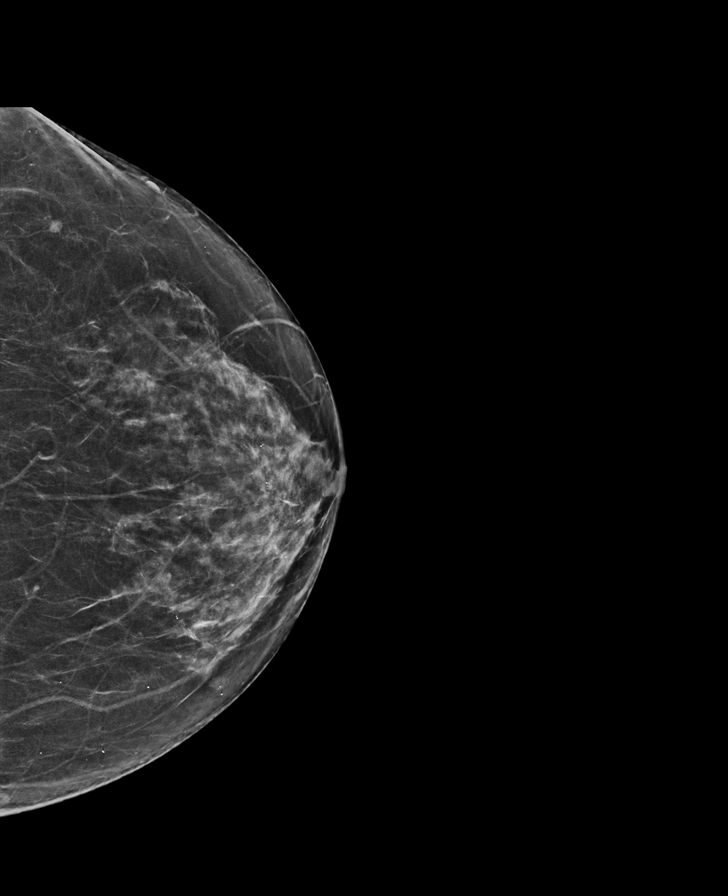

[R MLO synth-2D]
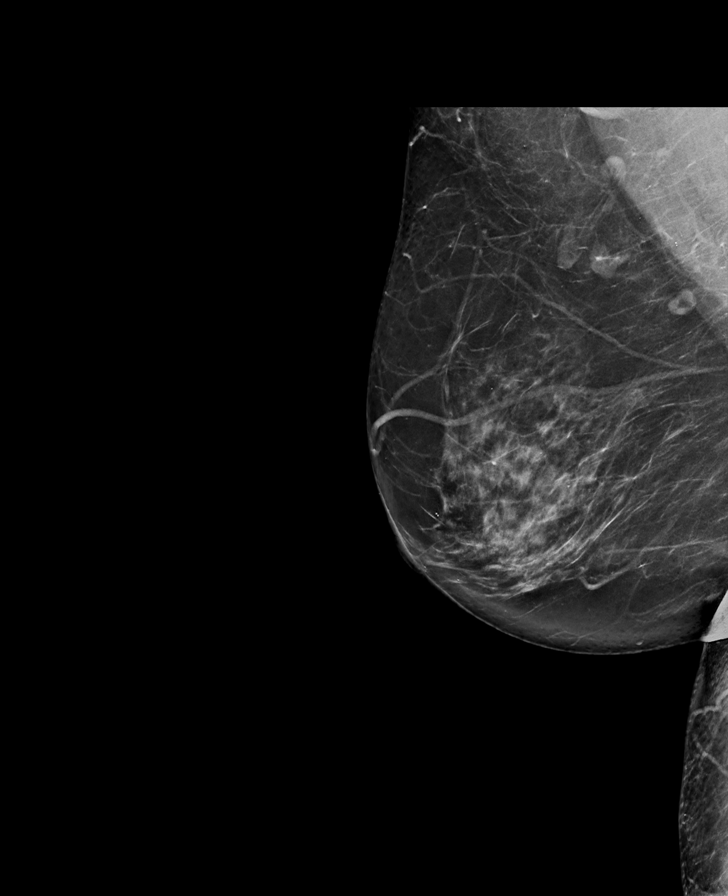

[L CC tomo · tomo slice 34/67.0]
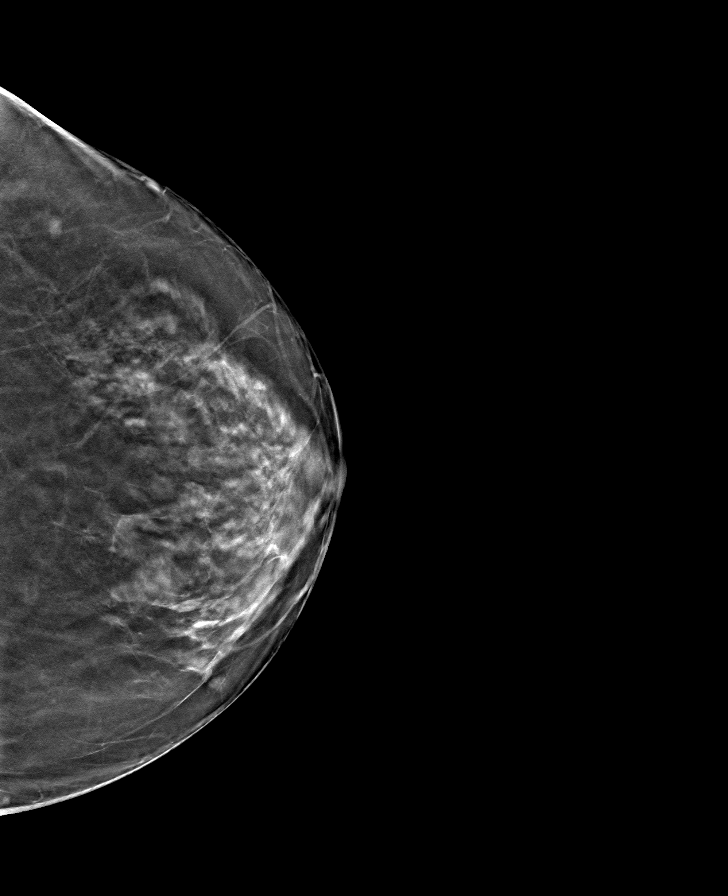

[R MLO tomo · tomo slice 41/81.0]
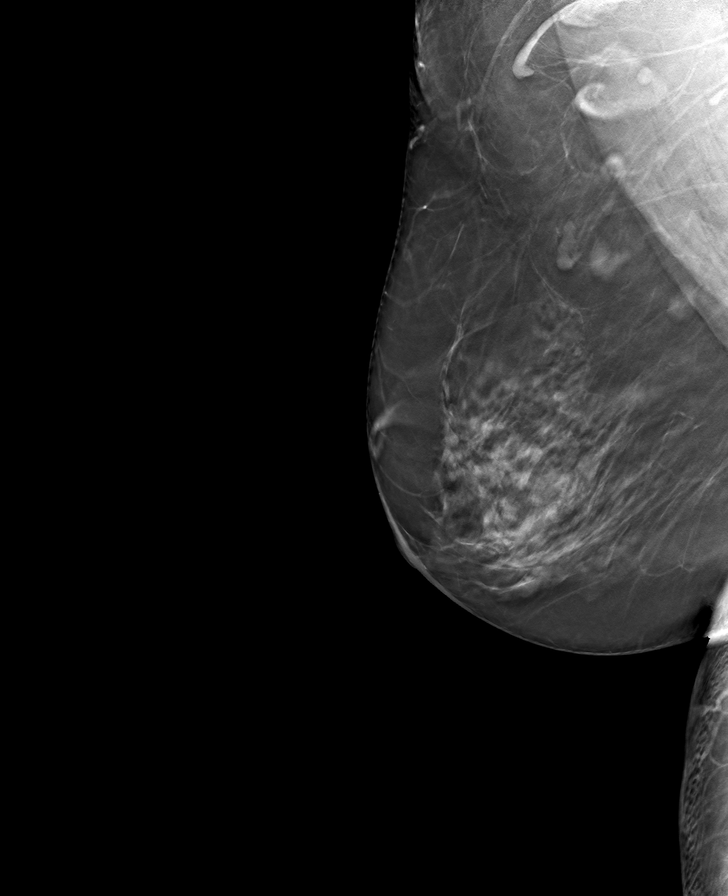

[R CC tomo · tomo slice 37/74.0]
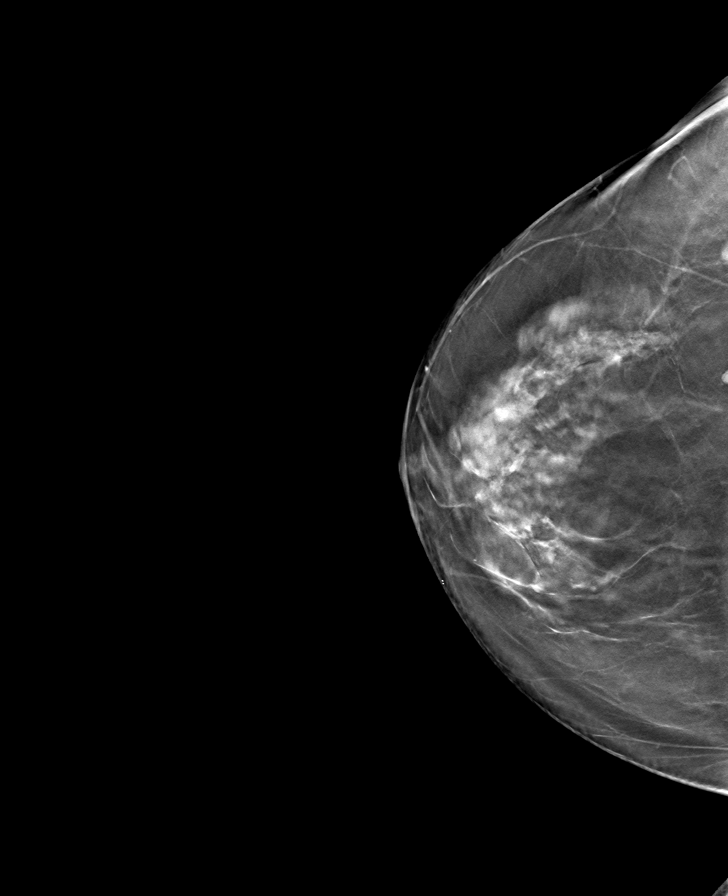

[L MLO tomo · tomo slice 37/74.0]
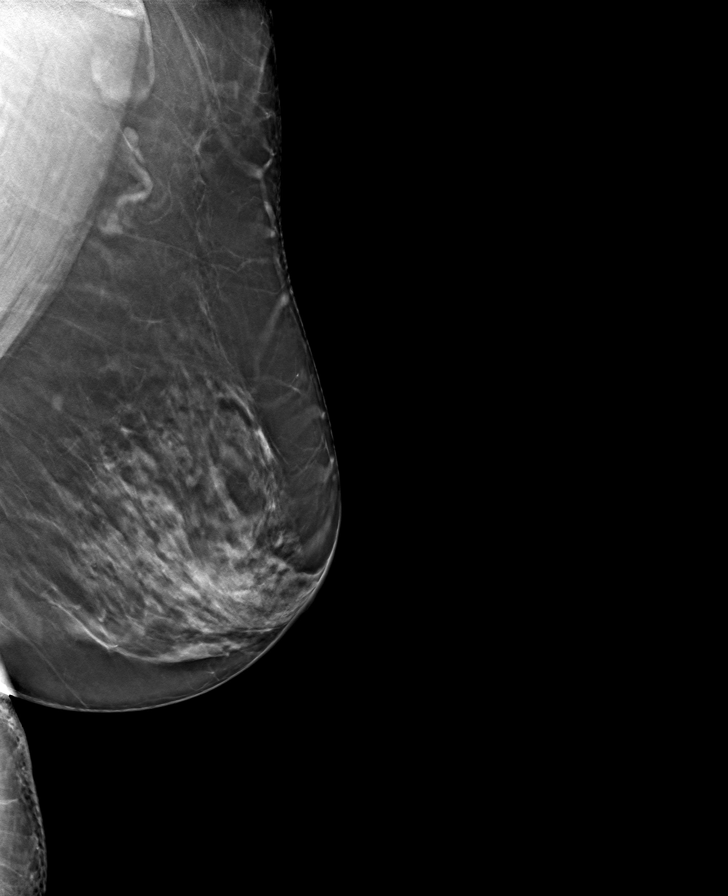

[8 of 24 positions shown; findings below may reference images not displayed]

ACR Breast Density Category b: There are scattered areas of
fibroglandular density.
FINDINGS: There are no findings suspicious for malignancy.
IMPRESSION: No mammographic evidence of malignancy. A result letter of this
screening mammogram will be mailed directly to the patient.

RECOMMENDATION:
Screening mammogram in one year. (Code:51-O-LD2)

BI-RADS CATEGORY  1: Negative.

## 2021-10-31 ENCOUNTER — Other Ambulatory Visit: Payer: Self-pay | Admitting: Nurse Practitioner

## 2021-10-31 DIAGNOSIS — F411 Generalized anxiety disorder: Secondary | ICD-10-CM

## 2021-11-05 NOTE — Telephone Encounter (Signed)
Last OV 06/28/21  ?Next OV 12/27/21 ?

## 2021-11-13 ENCOUNTER — Other Ambulatory Visit: Payer: Self-pay | Admitting: Nurse Practitioner

## 2021-11-13 DIAGNOSIS — K219 Gastro-esophageal reflux disease without esophagitis: Secondary | ICD-10-CM

## 2021-11-13 NOTE — Telephone Encounter (Signed)
Chart supports Rx  ?Last seen 06/2021 ?Next OV 12/2021 ? ?

## 2021-12-27 ENCOUNTER — Encounter: Payer: Self-pay | Admitting: Nurse Practitioner

## 2021-12-27 ENCOUNTER — Ambulatory Visit (INDEPENDENT_AMBULATORY_CARE_PROVIDER_SITE_OTHER): Payer: 59 | Admitting: Nurse Practitioner

## 2021-12-27 VITALS — BP 120/78 | HR 80 | Temp 98.0°F | Ht 63.0 in | Wt 203.4 lb

## 2021-12-27 DIAGNOSIS — R718 Other abnormality of red blood cells: Secondary | ICD-10-CM | POA: Diagnosis not present

## 2021-12-27 DIAGNOSIS — E785 Hyperlipidemia, unspecified: Secondary | ICD-10-CM | POA: Diagnosis not present

## 2021-12-27 DIAGNOSIS — Z23 Encounter for immunization: Secondary | ICD-10-CM | POA: Diagnosis not present

## 2021-12-27 DIAGNOSIS — I10 Essential (primary) hypertension: Secondary | ICD-10-CM

## 2021-12-27 DIAGNOSIS — R7989 Other specified abnormal findings of blood chemistry: Secondary | ICD-10-CM | POA: Diagnosis not present

## 2021-12-27 DIAGNOSIS — D582 Other hemoglobinopathies: Secondary | ICD-10-CM

## 2021-12-27 DIAGNOSIS — R7303 Prediabetes: Secondary | ICD-10-CM

## 2021-12-27 LAB — CBC WITH DIFFERENTIAL/PLATELET
Basophils Absolute: 0.1 10*3/uL (ref 0.0–0.1)
Basophils Relative: 0.9 % (ref 0.0–3.0)
Eosinophils Absolute: 0.1 10*3/uL (ref 0.0–0.7)
Eosinophils Relative: 2.4 % (ref 0.0–5.0)
HCT: 43.1 % (ref 36.0–46.0)
Hemoglobin: 14.5 g/dL (ref 12.0–15.0)
Lymphocytes Relative: 51.1 % — ABNORMAL HIGH (ref 12.0–46.0)
Lymphs Abs: 3.1 10*3/uL (ref 0.7–4.0)
MCHC: 33.6 g/dL (ref 30.0–36.0)
MCV: 92.3 fl (ref 78.0–100.0)
Monocytes Absolute: 0.5 10*3/uL (ref 0.1–1.0)
Monocytes Relative: 8.8 % (ref 3.0–12.0)
Neutro Abs: 2.2 10*3/uL (ref 1.4–7.7)
Neutrophils Relative %: 36.8 % — ABNORMAL LOW (ref 43.0–77.0)
Platelets: 249 10*3/uL (ref 150.0–400.0)
RBC: 4.68 Mil/uL (ref 3.87–5.11)
RDW: 13.5 % (ref 11.5–15.5)
WBC: 6.1 10*3/uL (ref 4.0–10.5)

## 2021-12-27 LAB — BASIC METABOLIC PANEL
BUN: 14 mg/dL (ref 6–23)
CO2: 24 mEq/L (ref 19–32)
Calcium: 9.7 mg/dL (ref 8.4–10.5)
Chloride: 102 mEq/L (ref 96–112)
Creatinine, Ser: 0.8 mg/dL (ref 0.40–1.20)
GFR: 80.73 mL/min (ref >60.00)
Glucose, Bld: 107 mg/dL — ABNORMAL HIGH (ref 70–99)
Potassium: 3.9 mEq/L (ref 3.5–5.1)
Sodium: 133 mEq/L — ABNORMAL LOW (ref 135–145)

## 2021-12-27 LAB — LIPID PANEL
Cholesterol: 157 mg/dL (ref 0–200)
HDL: 35.4 mg/dL — ABNORMAL LOW (ref >39.00)
LDL Cholesterol: 98 mg/dL (ref 0–99)
NonHDL: 121.86
Total CHOL/HDL Ratio: 4
Triglycerides: 119 mg/dL (ref 0.0–149.0)
VLDL: 23.8 mg/dL (ref 0.0–40.0)

## 2021-12-27 LAB — IBC + FERRITIN
Ferritin: 38.7 ng/mL (ref 10.0–291.0)
Iron: 116 ug/dL (ref 42–145)
Saturation Ratios: 26.6 % (ref 20.0–50.0)
TIBC: 435.4 ug/dL (ref 250.0–450.0)
Transferrin: 311 mg/dL (ref 212.0–360.0)

## 2021-12-27 LAB — HEPATIC FUNCTION PANEL
ALT: 20 U/L (ref 0–35)
AST: 29 U/L (ref 0–37)
Albumin: 3.8 g/dL (ref 3.5–5.2)
Alkaline Phosphatase: 85 U/L (ref 39–117)
Bilirubin, Direct: 0.2 mg/dL (ref 0.0–0.3)
Total Bilirubin: 0.5 mg/dL (ref 0.2–1.2)
Total Protein: 8.2 g/dL (ref 6.0–8.3)

## 2021-12-27 LAB — HEMOGLOBIN A1C: Hgb A1c MFr Bld: 6.4 % (ref 4.6–6.5)

## 2021-12-27 NOTE — Patient Instructions (Signed)
Go to lab  Quitting Smoking You will learn how quitting smoking can help prevent a variety of health problems and improve your health. To view the content, go to this web address: [email or web address].[email or web address].[email or web address]  This video will expire on: 10/15/2023. If you need access to this video following this date, please reach out to the healthcare provider who assigned it to you. This information is not intended to replace advice given to you by your health care provider. Make sure you discuss any questions you have with your health care provider. Elsevier Patient Education  [place].

## 2021-12-27 NOTE — Assessment & Plan Note (Signed)
Repeat hgbA1c 

## 2021-12-27 NOTE — Assessment & Plan Note (Signed)
Repeat lipid panel ?

## 2021-12-27 NOTE — Assessment & Plan Note (Signed)
Resolved with decreasing ETOH consumption

## 2021-12-27 NOTE — Assessment & Plan Note (Signed)
Repeat cbc and iron panel 

## 2021-12-27 NOTE — Assessment & Plan Note (Signed)
BP at goal with lisinopril/hctz BP Readings from Last 3 Encounters:  12/27/21 120/78  07/09/21 134/76  06/28/21 124/72   Repeat BMP Maintain med dose

## 2021-12-27 NOTE — Progress Notes (Signed)
Established Patient Visit  Patient: [name]   DOB: Sep 30, 1960   61 y.o. Female  MRN: [id number] Visit Date: 12/27/2021  Subjective:    Chief Complaint  Patient presents with   Office Visit    6 month f/u. 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   fenofibrate (TRICOR) 145 MG tablet Take 1 tablet (145 mg total) by mouth daily.   lansoprazole (PREVACID) 30 MG capsule TAKE 1 CAPSULE BY MOUTH DAILY AT 12 NOON. NO ADDITIONAL REFILL WITHOUT OFFICE VISIT   lisinopril-hydrochlorothiazide (ZESTORETIC) 20-12.5 MG tablet Take 1 tablet by mouth daily.   Loratadine (CLARITIN PO) Take 10 mg by mouth daily.   No facility-administered medications prior to visit.   Reviewed past medical and social history.   ROS per HPI above      Objective:  BP 120/78 (BP Location: Right Arm, Patient Position: Sitting, Cuff Size: Normal)   Pulse 80   Temp 98 F (36.7 C) (Temporal)   Ht '5\' 3"'$  (1.6 m)   Wt 203 lb 6.4 oz (92.3 kg)   SpO2 99%   BMI 36.03 kg/m      Physical Exam Cardiovascular:     Rate and Rhythm: Normal rate and regular  rhythm.     Pulses: Normal pulses.     Heart sounds: Normal heart sounds.  Pulmonary:     Effort: Pulmonary effort is normal.     Breath sounds: Normal breath sounds.  Musculoskeletal:     Right lower leg: No edema.     Left lower leg: No edema.  Neurological:     Mental Status: She is alert and oriented to person, place, and time.  Psychiatric:        Mood and Affect: Mood normal.        Behavior: Behavior normal.        Thought Content: Thought content normal.    No results found for any visits on 12/27/21.    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     Wilfred Lacy, NP

## 2021-12-27 NOTE — Assessment & Plan Note (Signed)
Repeat CBC and iron panel Last blood donation 08/2021 She plans to schedule another appt this month

## 2021-12-30 ENCOUNTER — Other Ambulatory Visit: Payer: Self-pay | Admitting: Nurse Practitioner

## 2021-12-30 DIAGNOSIS — E785 Hyperlipidemia, unspecified: Secondary | ICD-10-CM

## 2021-12-30 NOTE — Telephone Encounter (Signed)
Chart Supports OV Last OV: 12/2021

## 2022-02-02 ENCOUNTER — Other Ambulatory Visit: Payer: Self-pay | Admitting: Nurse Practitioner

## 2022-02-02 DIAGNOSIS — K219 Gastro-esophageal reflux disease without esophagitis: Secondary | ICD-10-CM

## 2022-02-03 NOTE — Telephone Encounter (Signed)
No refills w/o OV

## 2022-02-12 ENCOUNTER — Ambulatory Visit (INDEPENDENT_AMBULATORY_CARE_PROVIDER_SITE_OTHER): Payer: 59 | Admitting: Nurse Practitioner

## 2022-02-12 ENCOUNTER — Encounter: Payer: Self-pay | Admitting: Nurse Practitioner

## 2022-02-12 VITALS — BP 136/74 | HR 107 | Temp 97.6°F | Wt 206.6 lb

## 2022-02-12 DIAGNOSIS — N3001 Acute cystitis with hematuria: Secondary | ICD-10-CM | POA: Diagnosis not present

## 2022-02-12 DIAGNOSIS — R300 Dysuria: Secondary | ICD-10-CM | POA: Diagnosis not present

## 2022-02-12 DIAGNOSIS — K219 Gastro-esophageal reflux disease without esophagitis: Secondary | ICD-10-CM

## 2022-02-12 LAB — POCT URINALYSIS DIPSTICK
Bilirubin, UA: NEGATIVE
Blood, UA: POSITIVE
Glucose, UA: NEGATIVE
Ketones, UA: NEGATIVE
Nitrite, UA: POSITIVE
Protein, UA: POSITIVE — AB
Spec Grav, UA: 1.025 (ref 1.010–1.025)
Urobilinogen, UA: 1 E.U./dL
pH, UA: 6 (ref 5.0–8.0)

## 2022-02-12 MED ORDER — NITROFURANTOIN MONOHYD MACRO 100 MG PO CAPS: 100.0000 mg | ORAL_CAPSULE | Freq: Two times a day (BID) | ORAL | 0 refills | Status: DC

## 2022-02-12 MED ORDER — LANSOPRAZOLE 30 MG PO CPDR: DELAYED_RELEASE_CAPSULE | ORAL | 1 refills | Status: DC

## 2022-02-12 NOTE — Patient Instructions (Signed)
It was great to see you!  You have a UTI. Drink plenty of water/fluids. You can take azo over the counter to help with the pain. This can turn your urine orange. Start macrobid twice a day for 5 days with food.   Let's follow-up if your symptoms worsen or don't improve.   Take care,  Vance Peper, NP

## 2022-02-12 NOTE — Assessment & Plan Note (Signed)
Lansoprazole refill sent to the pharmacy.

## 2022-02-12 NOTE — Progress Notes (Signed)
Acute Office Visit  Subjective:     Patient ID: [name], female    DOB: 11/21/1960, 61 y.o.   MRN: [id number]  Chief Complaint  Patient presents with   Cystitis    Pt c/o bladder pressure, urinary frequency/incontinence, painful and burning sensation when urinating x1 day    HPI Patient is in today for dysuria, hematuria, and urinary frequency for the past day.   URINARY SYMPTOMS  Dysuria: burning Urinary frequency: yes Urgency: yes Small volume voids: yes Symptom severity:  moderate Urinary incontinence: yes Foul odor: yes Hematuria: yes Abdominal pain: no Back pain: no Suprapubic pain/pressure: yes Flank pain: no Fever:  no Vomiting: no Relief with cranberry juice:  n/a Relief with pyridium:  n/a Status: better/worse/stable Previous urinary tract infection: yes Recurrent urinary tract infection: no Treatments attempted: none   ROS See pertinent positives and negatives per HPI.     Objective:    BP 136/74 (BP Location: Left Arm, Patient Position: Sitting, Cuff Size: Large)   Pulse (!) 107   Temp 97.6 F (36.4 C) (Oral)   Wt 206 lb 9.6 oz (93.7 kg)   SpO2 97%   BMI 36.60 kg/m     Physical Exam Vitals and nursing note reviewed.  Constitutional:      General: She is not in acute distress.    Appearance: Normal appearance.  HENT:     Head: Normocephalic.  Eyes:     Conjunctiva/sclera: Conjunctivae normal.  Cardiovascular:     Rate and Rhythm: Normal rate and regular rhythm.     Pulses: Normal pulses.     Heart sounds: Normal heart sounds.  Pulmonary:     Effort: Pulmonary effort is normal.  Abdominal:     Palpations: Abdomen is soft.     Tenderness: There is no abdominal tenderness. There is no right CVA tenderness or left CVA tenderness.  Musculoskeletal:     Cervical back: Normal range of motion.  Skin:    General: Skin is warm.  Neurological:     General: No focal deficit present.     Mental Status: She is alert and oriented  to person, place, and time.  Psychiatric:        Mood and Affect: Mood normal.        Behavior: Behavior normal.        Thought Content: Thought content normal.        Judgment: Judgment normal.     Results for orders placed or performed in visit on 02/12/22  POCT urinalysis dipstick  Result Value Ref Range   Color, UA Red    Clarity, UA cloudy    Glucose, UA Negative Negative   Bilirubin, UA Neg    Ketones, UA neg    Spec Grav, UA 1.025 1.010 - 1.025   Blood, UA pos    pH, UA 6.0 5.0 - 8.0   Protein, UA Positive (A) Negative   Urobilinogen, UA 1.0 0.2 or 1.0 E.U./dL   Nitrite, UA pos    Leukocytes, UA Large (3+) (A) Negative   Appearance     Odor          Assessment & Plan:   Problem List Items Addressed This Visit       Digestive   GERD (gastroesophageal reflux disease)    Lansoprazole refill sent to the pharmacy.       Relevant Medications   lansoprazole (PREVACID) 30 MG capsule   Other Visit Diagnoses     Acute cystitis  with hematuria    -  Primary   Treat with macrobid BID x5 days. Encourage fluids, can take azo OTC to help with pain. F/U if symptoms don't improve. Urine culture added   Relevant Orders   Urine Culture   Dysuria       U/A positive for blood, protein, and 3+ leukocytes. With symptoms will treat for UTI   Relevant Orders   POCT urinalysis dipstick (Completed)       Meds ordered this encounter  Medications   nitrofurantoin, macrocrystal-monohydrate, (MACROBID) 100 MG capsule    Sig: Take 1 capsule (100 mg total) by mouth 2 (two) times daily.    Dispense:  10 capsule    Refill:  0   lansoprazole (PREVACID) 30 MG capsule    Sig: TAKE 1 CAPSULE BY MOUTH DAILY AT 12 NOON.    Dispense:  90 capsule    Refill:  1    Return if symptoms worsen or fail to improve.  Charyl Dancer, NP

## 2022-02-14 LAB — URINE CULTURE
MICRO NUMBER:: 13667508
SPECIMEN QUALITY:: ADEQUATE

## 2022-02-19 ENCOUNTER — Ambulatory Visit (INDEPENDENT_AMBULATORY_CARE_PROVIDER_SITE_OTHER): Payer: 59 | Admitting: Family Medicine

## 2022-02-19 ENCOUNTER — Telehealth: Payer: Self-pay | Admitting: Nurse Practitioner

## 2022-02-19 ENCOUNTER — Encounter: Payer: Self-pay | Admitting: Family Medicine

## 2022-02-19 VITALS — BP 116/62 | HR 96 | Temp 97.4°F | Wt 206.2 lb

## 2022-02-19 DIAGNOSIS — N3090 Cystitis, unspecified without hematuria: Secondary | ICD-10-CM | POA: Diagnosis not present

## 2022-02-19 DIAGNOSIS — N3000 Acute cystitis without hematuria: Secondary | ICD-10-CM

## 2022-02-19 DIAGNOSIS — R399 Unspecified symptoms and signs involving the genitourinary system: Secondary | ICD-10-CM

## 2022-02-19 LAB — POCT URINALYSIS DIPSTICK
Bilirubin, UA: POSITIVE
Blood, UA: NEGATIVE
Glucose, UA: NEGATIVE
Ketones, UA: NEGATIVE
Nitrite, UA: POSITIVE
Protein, UA: NEGATIVE
Spec Grav, UA: 1.025 (ref 1.010–1.025)
Urobilinogen, UA: 2 E.U./dL — AB
pH, UA: 6 (ref 5.0–8.0)

## 2022-02-19 MED ORDER — PHENAZOPYRIDINE HCL 200 MG PO TABS: 200.0000 mg | ORAL_TABLET | Freq: Three times a day (TID) | ORAL | 0 refills | Status: DC | PRN

## 2022-02-19 MED ORDER — CEPHALEXIN 500 MG PO CAPS: 500.0000 mg | ORAL_CAPSULE | Freq: Four times a day (QID) | ORAL | 0 refills | Status: AC

## 2022-02-19 NOTE — Progress Notes (Signed)
Assessment/Plan:   Problem List Items Addressed This Visit       Genitourinary   Acute cystitis without hematuria    Recurrent symptoms, UA with leukocyte esterase and nitrites About 1 week ago patient also had cystitis secondary to UTI, pansensitive status post treatment with Macrobid for 5 days Repeat urine culture Keflex 500 mg 4 times daily x7 days Return precautions discussed      Relevant Medications   phenazopyridine (PYRIDIUM) 200 MG tablet   cephALEXin (KEFLEX) 500 MG capsule   Other Relevant Orders   Urine culture   Other Visit Diagnoses     UTI symptoms    -  Primary   Relevant Medications   phenazopyridine (PYRIDIUM) 200 MG tablet   cephALEXin (KEFLEX) 500 MG capsule   Other Relevant Orders   POCT Urinalysis Dipstick (Completed)   Urine culture   Cystitis       Relevant Medications   phenazopyridine (PYRIDIUM) 200 MG tablet   cephALEXin (KEFLEX) 500 MG capsule   Other Relevant Orders   Urine culture          Subjective:  HPI:  [name] is a 61 y.o. 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   She  has a past medical history of Allergic rhinitis and Hypertension..   She presents with chief complaint of Urinary Tract Infection (Burning and painful urination X 1 Day. Has been treating with AZO) .   Urinary symptoms  She reports recurrent dysuria, urinary frequency, and urinary urgency. The current episode started today and is staying constant. Patient states symptoms are moderate in intensity, occurring intermittently. She  has been recently treated for similar symptoms. Dx UTI on 7/18,  Ucx grew E coli pan sensitive, patient treated w/ macrobid 100 mg bid x5 days + AZO reports improvemnt   Associated symptoms: No abdominal pain No back pain  No chills No constipation  No cramping No diarrhea  No discharge No fever  No hematuria No nausea  No vomiting    ---------------------------------------------------------------------------------------   Past Surgical History:  Procedure Laterality Date   APPENDECTOMY     EXPLORATORY LAPAROTOMY  1981    Outpatient Medications Prior to Visit  Medication Sig Dispense Refill   ALPRAZolam (XANAX) 0.25 MG tablet TAKE 1 TABLET BY MOUTH DAILY AS NEEDED. 30 tablet 0   fenofibrate (TRICOR) 145 MG tablet TAKE 1 TABLET BY MOUTH EVERY DAY 90 tablet 1   lansoprazole (PREVACID) 30 MG capsule TAKE 1 CAPSULE BY MOUTH DAILY AT 12 NOON. 90 capsule 1   lisinopril-hydrochlorothiazide (ZESTORETIC) 20-12.5 MG tablet Take 1 tablet by mouth daily. 90 tablet 3   Loratadine (CLARITIN PO) Take 10 mg by mouth daily.     nitrofurantoin, macrocrystal-monohydrate, (MACROBID) 100 MG capsule Take 1 capsule (100 mg total) by mouth 2 (two) times daily. (Patient not taking: Reported on 02/19/2022) 10 capsule 0   No facility-administered medications prior to visit.    Family History  Problem Relation Age of Onset   Colon polyps Mother    Breast cancer Sister 33   Breast cancer Maternal Aunt    Colon cancer Neg Hx    Stomach cancer Neg Hx    Rectal cancer Neg Hx  Esophageal cancer Neg Hx     Social History   Socioeconomic History   Marital status: Single    Spouse name: Not on file   Number of children: Not on file   Years of education: Not on file   Highest education level: Not on file  Occupational History   Not on file  Tobacco Use   Smoking status: Every Day    Packs/day: 0.50    Years: 15.00    Total pack years: 7.50    Types: Cigarettes   Smokeless tobacco: Current  Substance and Sexual Activity   Alcohol use: Yes    Alcohol/week:  4.0 standard drinks of alcohol    Types: 4 Cans of beer per week   Drug use: No   Sexual activity: Not Currently    Birth control/protection: Abstinence  Other Topics Concern   Not on file  Social History Narrative   Not on file   Social Determinants of Health   Financial Resource Strain: Not on file  Food Insecurity: Not on file  Transportation Needs: Not on file  Physical Activity: Not on file  Stress: Not on file  Social Connections: Not on file  Intimate Partner Violence: Not on file                                                                                                 Objective:  Physical Exam: BP 116/62 (BP Location: Left Arm, Patient Position: Sitting, Cuff Size: Large)   Pulse 96   Temp (!) 97.4 F (36.3 C) (Temporal)   Wt 206 lb 3.2 oz (93.5 kg)   SpO2 98%   BMI 36.53 kg/m    General: No acute distress. Awake and conversant.  Eyes: Normal conjunctiva, anicteric. Round symmetric pupils.  ENT: Hearing grossly intact. No nasal discharge.  Neck: Neck is supple. No masses or thyromegaly.  Respiratory: Respirations are non-labored. No auditory wheezing.  Skin: Warm. No rashes or ulcers.  Psych: Alert and oriented. Cooperative, Appropriate mood and affect, Normal judgment.  CV: No cyanosis or JVD ABD: Nontender nondistended MSK: Normal ambulation. No clubbing  Neuro: Sensation and CN II-XII grossly normal.        Alesia Banda, MD, MS

## 2022-02-19 NOTE — Assessment & Plan Note (Signed)
Recurrent symptoms, UA with leukocyte esterase and nitrites About 1 week ago patient also had cystitis secondary to UTI, pansensitive status post treatment with Macrobid for 5 days Repeat urine culture Keflex 500 mg 4 times daily x7 days Return precautions discussed

## 2022-02-19 NOTE — Telephone Encounter (Signed)
Called and scheduled pt appt for today '@2pm'$ 

## 2022-02-19 NOTE — Telephone Encounter (Signed)
Pt was seen on 02/12/22 by Ander Purpura for a uti. She is still feeling urgency when going to the bathroom. She would like to know if she can get a refill on what she was given on that date. [id number] [place] [id number] Phone:  [phone] Fax:  [phone] DEA #:  [id number]

## 2022-02-19 NOTE — Patient Instructions (Signed)
Urinary Tract Infection, Adult  A urinary tract infection (UTI) is an infection of any part of the urinary tract. The urinary tract includes the kidneys, ureters, bladder, and urethra. These organs make, store, and get rid of urine in the body. An upper UTI affects the ureters and kidneys. A lower UTI affects the bladder and urethra. What are the causes? Most urinary tract infections are caused by bacteria in your genital area around your urethra, where urine leaves your body. These bacteria grow and cause inflammation of your urinary tract. What increases the risk? You are more likely to develop this condition if: You have a urinary catheter that stays in place. You are not able to control when you urinate or have a bowel movement (incontinence). You are female and you: Use a spermicide or diaphragm for birth control. Have low estrogen levels. Are pregnant. You have certain genes that increase your risk. You are sexually active. You take antibiotic medicines. You have a condition that causes your flow of urine to slow down, such as: An enlarged prostate, if you are female. Blockage in your urethra. A kidney stone. A nerve condition that affects your bladder control (neurogenic bladder). Not getting enough to drink, or not urinating often. You have certain medical conditions, such as: Diabetes. A weak disease-fighting system (immunesystem). Sickle cell disease. Gout. Spinal cord injury. What are the signs or symptoms? Symptoms of this condition include: Needing to urinate right away (urgency). Frequent urination. This may include small amounts of urine each time you urinate. Pain or burning with urination. Blood in the urine. Urine that smells bad or unusual. Trouble urinating. Cloudy urine. Vaginal discharge, if you are female. Pain in the abdomen or the lower back. You may also have: Vomiting or a decreased appetite. Confusion. Irritability or tiredness. A fever or  chills. Diarrhea. The first symptom in older adults may be confusion. In some cases, they may not have any symptoms until the infection has worsened. How is this diagnosed? This condition is diagnosed based on your medical history and a physical exam. You may also have other tests, including: Urine tests. Blood tests. Tests for STIs (sexually transmitted infections). If you have had more than one UTI, a cystoscopy or imaging studies may be done to determine the cause of the infections. How is this treated? Treatment for this condition includes: Antibiotic medicine. Over-the-counter medicines to treat discomfort. Drinking enough water to stay hydrated. If you have frequent infections or have other conditions such as a kidney stone, you may need to see a health care provider who specializes in the urinary tract (urologist). In rare cases, urinary tract infections can cause sepsis. Sepsis is a life-threatening condition that occurs when the body responds to an infection. Sepsis is treated in the hospital with IV antibiotics, fluids, and other medicines. Follow these instructions at home:  Medicines Take over-the-counter and prescription medicines only as told by your health care provider. If you were prescribed an antibiotic medicine, take it as told by your health care provider. Do not stop using the antibiotic even if you start to feel better. General instructions Make sure you: Empty your bladder often and completely. Do not hold urine for long periods of time. Empty your bladder after sex. Wipe from front to back after urinating or having a bowel movement if you are female. Use each tissue only one time when you wipe. Drink enough fluid to keep your urine pale yellow. Keep all follow-up visits. This is important. Contact a health   care provider if: Your symptoms do not get better after 1-2 days. Your symptoms go away and then return. Get help right away if: You have severe pain in  your back or your lower abdomen. You have a fever or chills. You have nausea or vomiting. Summary A urinary tract infection (UTI) is an infection of any part of the urinary tract, which includes the kidneys, ureters, bladder, and urethra. Most urinary tract infections are caused by bacteria in your genital area. Treatment for this condition often includes antibiotic medicines. If you were prescribed an antibiotic medicine, take it as told by your health care provider. Do not stop using the antibiotic even if you start to feel better. Keep all follow-up visits. This is important. This information is not intended to replace advice given to you by your health care provider. Make sure you discuss any questions you have with your health care provider. Document Revised: 02/24/2020 Document Reviewed: 02/24/2020 Elsevier Patient Education  2023 Elsevier Inc.  

## 2022-02-21 LAB — URINE CULTURE
MICRO NUMBER:: 13697330
SPECIMEN QUALITY:: ADEQUATE

## 2022-03-03 ENCOUNTER — Telehealth: Payer: Self-pay | Admitting: Nurse Practitioner

## 2022-03-03 DIAGNOSIS — K219 Gastro-esophageal reflux disease without esophagitis: Secondary | ICD-10-CM

## 2022-03-03 MED ORDER — LANSOPRAZOLE 30 MG PO CPDR: DELAYED_RELEASE_CAPSULE | ORAL | 1 refills | Status: DC

## 2022-03-03 NOTE — Telephone Encounter (Signed)
Chart supports Rx Last OV: 01/2022 Next OV: not scheduled

## 2022-03-03 NOTE — Telephone Encounter (Signed)
Caller Name: Surabhi Gadea Call back phone #: [phone]  MEDICATION(S): Lansoprazole   Has the patient contacted their pharmacy (YES/NO)? yes What did pharmacy advise? They need permission for [place] to refill prescription  [facility] [address] [place], [place] [address]  ~~~Please advise patient/caregiver to allow 2-3 business days to process RX refills.

## 2022-04-21 ENCOUNTER — Other Ambulatory Visit: Payer: Self-pay | Admitting: Nurse Practitioner

## 2022-04-21 DIAGNOSIS — F411 Generalized anxiety disorder: Secondary | ICD-10-CM

## 2022-06-23 ENCOUNTER — Other Ambulatory Visit: Payer: Self-pay | Admitting: Nurse Practitioner

## 2022-06-23 DIAGNOSIS — E785 Hyperlipidemia, unspecified: Secondary | ICD-10-CM

## 2022-06-23 NOTE — Telephone Encounter (Signed)
Chart supports Rx Last OV: 01/2022 Next OV: not scheduled ,30 days supply sent, pt needs f/u appt for further refills.

## 2022-07-16 ENCOUNTER — Other Ambulatory Visit: Payer: Self-pay | Admitting: Nurse Practitioner

## 2022-07-16 DIAGNOSIS — E785 Hyperlipidemia, unspecified: Secondary | ICD-10-CM

## 2022-07-16 NOTE — Telephone Encounter (Signed)
Chart supports Rx Last OV: 01/2022 Next OV: not scheduled, pt needs f/u appt for further refills

## 2022-08-03 ENCOUNTER — Other Ambulatory Visit: Payer: Self-pay | Admitting: Nurse Practitioner

## 2022-08-03 DIAGNOSIS — I10 Essential (primary) hypertension: Secondary | ICD-10-CM

## 2022-08-04 ENCOUNTER — Other Ambulatory Visit: Payer: Self-pay

## 2022-08-04 ENCOUNTER — Telehealth: Payer: Self-pay | Admitting: Nurse Practitioner

## 2022-08-04 DIAGNOSIS — I10 Essential (primary) hypertension: Secondary | ICD-10-CM

## 2022-08-04 MED ORDER — LISINOPRIL-HYDROCHLOROTHIAZIDE 20-12.5 MG PO TABS: 1.0000 | ORAL_TABLET | Freq: Every day | ORAL | 0 refills | Status: DC

## 2022-08-04 NOTE — Telephone Encounter (Signed)
30 day supply sent w 0 refills. Pt needs to keep upcoming appt to receive further refills

## 2022-08-04 NOTE — Telephone Encounter (Signed)
Caller Name: pt Call back phone #: [phone]   MEDICATION(S):  lisinopril-hydrochlorothiazide (ZESTORETIC) 20-12.5 MG tablet [[id number]]   Days of Med Remaining:   Has the patient contacted their pharmacy (YES/NO)? no What did pharmacy advise?   [facility]erred [facility]acy[facility]  [id number] [place] [place]: [phone] Fax: [phone] #: [id number] DAW Reason: --    ~~~Please advise patient/caregiver to allow 2-3 business days to process RX refills.

## 2022-08-18 ENCOUNTER — Other Ambulatory Visit: Payer: Self-pay | Admitting: Nurse Practitioner

## 2022-08-18 DIAGNOSIS — F411 Generalized anxiety disorder: Secondary | ICD-10-CM

## 2022-08-20 ENCOUNTER — Ambulatory Visit: Payer: 59 | Admitting: Nurse Practitioner

## 2022-08-20 ENCOUNTER — Encounter: Payer: Self-pay | Admitting: Nurse Practitioner

## 2022-08-20 VITALS — BP 126/80 | HR 86 | Temp 97.4°F | Ht 63.0 in | Wt 203.0 lb

## 2022-08-20 DIAGNOSIS — R7989 Other specified abnormal findings of blood chemistry: Secondary | ICD-10-CM

## 2022-08-20 DIAGNOSIS — Z720 Tobacco use: Secondary | ICD-10-CM | POA: Diagnosis not present

## 2022-08-20 DIAGNOSIS — R7303 Prediabetes: Secondary | ICD-10-CM | POA: Diagnosis not present

## 2022-08-20 DIAGNOSIS — E785 Hyperlipidemia, unspecified: Secondary | ICD-10-CM | POA: Diagnosis not present

## 2022-08-20 DIAGNOSIS — D582 Other hemoglobinopathies: Secondary | ICD-10-CM

## 2022-08-20 DIAGNOSIS — I10 Essential (primary) hypertension: Secondary | ICD-10-CM

## 2022-08-20 DIAGNOSIS — F411 Generalized anxiety disorder: Secondary | ICD-10-CM

## 2022-08-20 DIAGNOSIS — K219 Gastro-esophageal reflux disease without esophagitis: Secondary | ICD-10-CM

## 2022-08-20 DIAGNOSIS — R718 Other abnormality of red blood cells: Secondary | ICD-10-CM | POA: Diagnosis not present

## 2022-08-20 DIAGNOSIS — J309 Allergic rhinitis, unspecified: Secondary | ICD-10-CM | POA: Diagnosis not present

## 2022-08-20 DIAGNOSIS — R69 Illness, unspecified: Secondary | ICD-10-CM | POA: Diagnosis not present

## 2022-08-20 LAB — BASIC METABOLIC PANEL
BUN: 15 mg/dL (ref 6–23)
CO2: 27 mEq/L (ref 19–32)
Calcium: 9.6 mg/dL (ref 8.4–10.5)
Chloride: 101 mEq/L (ref 96–112)
Creatinine, Ser: 0.8 mg/dL (ref 0.40–1.20)
GFR: 80.37 mL/min (ref >60.00)
Glucose, Bld: 116 mg/dL — ABNORMAL HIGH (ref 70–99)
Potassium: 4.1 mEq/L (ref 3.5–5.1)
Sodium: 136 mEq/L (ref 135–145)

## 2022-08-20 LAB — IBC + FERRITIN
Ferritin: 30.7 ng/mL (ref 10.0–291.0)
Iron: 215 ug/dL — ABNORMAL HIGH (ref 42–145)
Saturation Ratios: 47.5 % (ref 20.0–50.0)
TIBC: 452.2 ug/dL — ABNORMAL HIGH (ref 250.0–450.0)
Transferrin: 323 mg/dL (ref 212.0–360.0)

## 2022-08-20 LAB — HEPATIC FUNCTION PANEL
ALT: 17 U/L (ref 0–35)
AST: 28 U/L (ref 0–37)
Albumin: 4.1 g/dL (ref 3.5–5.2)
Alkaline Phosphatase: 83 U/L (ref 39–117)
Bilirubin, Direct: 0.1 mg/dL (ref 0.0–0.3)
Total Bilirubin: 0.6 mg/dL (ref 0.2–1.2)
Total Protein: 8.2 g/dL (ref 6.0–8.3)

## 2022-08-20 LAB — CBC
HCT: 45.8 % (ref 36.0–46.0)
Hemoglobin: 15.5 g/dL — ABNORMAL HIGH (ref 12.0–15.0)
MCHC: 33.9 g/dL (ref 30.0–36.0)
MCV: 93.1 fl (ref 78.0–100.0)
Platelets: 274 10*3/uL (ref 150.0–400.0)
RBC: 4.92 Mil/uL (ref 3.87–5.11)
RDW: 13.4 % (ref 11.5–15.5)
WBC: 7.1 10*3/uL (ref 4.0–10.5)

## 2022-08-20 LAB — LIPID PANEL
Cholesterol: 181 mg/dL (ref 0–200)
HDL: 36.2 mg/dL — ABNORMAL LOW (ref >39.00)
LDL Cholesterol: 113 mg/dL — ABNORMAL HIGH (ref 0–99)
NonHDL: 144.45
Total CHOL/HDL Ratio: 5
Triglycerides: 156 mg/dL — ABNORMAL HIGH (ref 0.0–149.0)
VLDL: 31.2 mg/dL (ref 0.0–40.0)

## 2022-08-20 LAB — HEMOGLOBIN A1C: Hgb A1c MFr Bld: 6.3 % (ref 4.6–6.5)

## 2022-08-20 MED ORDER — LISINOPRIL-HYDROCHLOROTHIAZIDE 20-12.5 MG PO TABS: 1.0000 | ORAL_TABLET | Freq: Every day | ORAL | 1 refills | Status: DC

## 2022-08-20 MED ORDER — ALPRAZOLAM 0.25 MG PO TABS: 0.2500 mg | ORAL_TABLET | Freq: Every day | ORAL | 1 refills | Status: DC | PRN

## 2022-08-20 MED ORDER — AZELASTINE HCL 0.1 % NA SOLN: 1.0000 | Freq: Two times a day (BID) | NASAL | 5 refills | Status: DC

## 2022-08-20 MED ORDER — LANSOPRAZOLE 30 MG PO CPDR: DELAYED_RELEASE_CAPSULE | ORAL | 3 refills | Status: DC

## 2022-08-20 NOTE — Assessment & Plan Note (Signed)
Controlled symptoms with prevacid. Symptoms worsen without medication. Med refill sent

## 2022-08-20 NOTE — Assessment & Plan Note (Addendum)
Smokes 1/2 to 1ppd x 6yr. No cough, no SOB, no claudication, no LE edema Denies need for nicoderm or medication at this time Declined CT chest for lung cancer screen and AAA screen.

## 2022-08-20 NOTE — Assessment & Plan Note (Signed)
Minimal improvement with claritin Sent azelastine spray

## 2022-08-20 NOTE — Assessment & Plan Note (Signed)
Repeat lipid panel ?

## 2022-08-20 NOTE — Assessment & Plan Note (Signed)
Repeat cbc and ferritin level

## 2022-08-20 NOTE — Assessment & Plan Note (Signed)
Bp at goal with lisinopril/hctz BP Readings from Last 3 Encounters:  08/20/22 126/80  02/19/22 116/62  02/12/22 136/74    Maintain med dose Check BMP

## 2022-08-20 NOTE — Assessment & Plan Note (Signed)
Resolved with ETOH cessation

## 2022-08-20 NOTE — Assessment & Plan Note (Signed)
Last phlebotomy 41month ago Repeat cbc and iron panel

## 2022-08-20 NOTE — Patient Instructions (Signed)
Go to lab Continue to work on tobacco cessation Maintain heart healthy diet and daily exercise  DASH Eating Plan DASH stands for Dietary Approaches to Stop Hypertension. The DASH eating plan is a healthy eating plan that has been shown to: Reduce high blood pressure (hypertension). Reduce your risk for type 2 diabetes, heart disease, and stroke. Help with weight loss. What are tips for following this plan? Reading food labels Check food labels for the amount of salt (sodium) per serving. Choose foods with less than 5 percent of the Daily Value of sodium. Generally, foods with less than 300 milligrams (mg) of sodium per serving fit into this eating plan. To find whole grains, look for the word "whole" as the first word in the ingredient list. Shopping Buy products labeled as "low-sodium" or "no salt added." Buy fresh foods. Avoid canned foods and pre-made or frozen meals. Cooking Avoid adding salt when cooking. Use salt-free seasonings or herbs instead of table salt or sea salt. Check with your health care provider or pharmacist before using salt substitutes. Do not fry foods. Cook foods using healthy methods such as baking, boiling, grilling, roasting, and broiling instead. Cook with heart-healthy oils, such as olive, canola, avocado, soybean, or sunflower oil. Meal planning  Eat a balanced diet that includes: 4 or more servings of fruits and 4 or more servings of vegetables each day. Try to fill one-half of your plate with fruits and vegetables. 6-8 servings of whole grains each day. Less than 6 oz (170 g) of lean meat, poultry, or fish each day. A 3-oz (85-g) serving of meat is about the same size as a deck of cards. One egg equals 1 oz (28 g). 2-3 servings of low-fat dairy each day. One serving is 1 cup (237 mL). 1 serving of nuts, seeds, or beans 5 times each week. 2-3 servings of heart-healthy fats. Healthy fats called omega-3 fatty acids are found in foods such as walnuts,  flaxseeds, fortified milks, and eggs. These fats are also found in cold-water fish, such as sardines, salmon, and mackerel. Limit how much you eat of: Canned or prepackaged foods. Food that is high in trans fat, such as some fried foods. Food that is high in saturated fat, such as fatty meat. Desserts and other sweets, sugary drinks, and other foods with added sugar. Full-fat dairy products. Do not salt foods before eating. Do not eat more than 4 egg yolks a week. Try to eat at least 2 vegetarian meals a week. Eat more home-cooked food and less restaurant, buffet, and fast food. Lifestyle When eating at a restaurant, ask that your food be prepared with less salt or no salt, if possible. If you drink alcohol: Limit how much you use to: 0-1 drink a day for women who are not pregnant. 0-2 drinks a day for men. Be aware of how much alcohol is in your drink. In the [place] one drink equals one 12 oz bottle of beer (355 mL), one 5 oz glass of wine (148 mL), or one 1 oz glass of hard liquor (44 mL). General information Avoid eating more than 2,300 mg of salt a day. If you have hypertension, you may need to reduce your sodium intake to 1,500 mg a day. Work with your health care provider to maintain a healthy body weight or to lose weight. Ask what an ideal weight is for you. Get at least 30 minutes of exercise that causes your heart to beat faster (aerobic exercise) most days of the  week. Activities may include walking, swimming, or biking. Work with your health care provider or dietitian to adjust your eating plan to your individual calorie needs. What foods should I eat? Fruits All fresh, dried, or frozen fruit. Canned fruit in natural juice (without added sugar). Vegetables Fresh or frozen vegetables (raw, steamed, roasted, or grilled). Low-sodium or reduced-sodium tomato and vegetable juice. Low-sodium or reduced-sodium tomato sauce and tomato paste. Low-sodium or reduced-sodium canned  vegetables. Grains Whole-grain or whole-wheat bread. Whole-grain or whole-wheat pasta. Brown rice. [place]. Bulgur. Whole-grain and low-sodium cereals. Pita bread. Low-fat, low-sodium crackers. Whole-wheat flour tortillas. Meats and other proteins Skinless chicken or [place]. Ground chicken or [place]. Pork with fat trimmed off. Fish and seafood. Egg whites. Dried beans, peas, or lentils. Unsalted nuts, nut butters, and seeds. Unsalted canned beans. Lean cuts of beef with fat trimmed off. Low-sodium, lean precooked or cured meat, such as sausages or meat loaves. Dairy Low-fat (1%) or fat-free (skim) milk. Reduced-fat, low-fat, or fat-free cheeses. Nonfat, low-sodium ricotta or cottage cheese. Low-fat or nonfat yogurt. Low-fat, low-sodium cheese. Fats and oils Soft margarine without trans fats. Vegetable oil. Reduced-fat, low-fat, or light mayonnaise and salad dressings (reduced-sodium). Canola, safflower, olive, avocado, soybean, and sunflower oils. Avocado. Seasonings and condiments Herbs. Spices. Seasoning mixes without salt. Other foods Unsalted popcorn and pretzels. Fat-free sweets. The items listed above may not be a complete list of foods and beverages you can eat. Contact a dietitian for more information. What foods should I avoid? Fruits Canned fruit in a light or heavy syrup. Fried fruit. Fruit in cream or butter sauce. Vegetables Creamed or fried vegetables. Vegetables in a cheese sauce. Regular canned vegetables (not low-sodium or reduced-sodium). Regular canned tomato sauce and paste (not low-sodium or reduced-sodium). Regular tomato and vegetable juice (not low-sodium or reduced-sodium). Angie Fava. Olives. Grains Baked goods made with fat, such as croissants, muffins, or some breads. Dry pasta or rice meal packs. Meats and other proteins Fatty cuts of meat. Ribs. Fried meat. Berniece Salines. Bologna, salami, and other precooked or cured meats, such as sausages or meat loaves. Fat from  the back of a pig (fatback). Bratwurst. Salted nuts and seeds. Canned beans with added salt. Canned or smoked fish. Whole eggs or egg yolks. Chicken or [place] with skin. Dairy Whole or 2% milk, cream, and half-and-half. Whole or full-fat cream cheese. Whole-fat or sweetened yogurt. Full-fat cheese. Nondairy creamers. Whipped toppings. Processed cheese and cheese spreads. Fats and oils Butter. Stick margarine. Lard. Shortening. Ghee. Bacon fat. Tropical oils, such as coconut, palm kernel, or palm oil. Seasonings and condiments Onion salt, garlic salt, seasoned salt, table salt, and sea salt. Worcestershire sauce. Tartar sauce. Barbecue sauce. Teriyaki sauce. Soy sauce, including reduced-sodium. Steak sauce. Canned and packaged gravies. Fish sauce. Oyster sauce. Cocktail sauce. Store-bought horseradish. Ketchup. Mustard. Meat flavorings and tenderizers. Bouillon cubes. Hot sauces. Pre-made or packaged marinades. Pre-made or packaged taco seasonings. Relishes. Regular salad dressings. Other foods Salted popcorn and pretzels. The items listed above may not be a complete list of foods and beverages you should avoid. Contact a dietitian for more information. Where to find more information National Heart, Lung, and Blood Institute: https://wilson-eaton.com/ American Heart Association: www.heart.org Academy of Nutrition and Dietetics: www.eatright.[place]: www.kidney.org Summary The DASH eating plan is a healthy eating plan that has been shown to reduce high blood pressure (hypertension). It may also reduce your risk for type 2 diabetes, heart disease, and stroke. When on the DASH eating plan, aim to eat  more fresh fruits and vegetables, whole grains, lean proteins, low-fat dairy, and heart-healthy fats. With the DASH eating plan, you should limit salt (sodium) intake to 2,300 mg a day. If you have hypertension, you may need to reduce your sodium intake to 1,500 mg a day. Work with your  health care provider or dietitian to adjust your eating plan to your individual calorie needs. This information is not intended to replace advice given to you by your health care provider. Make sure you discuss any questions you have with your health care provider. Document Revised: 06/17/2019 Document Reviewed: 06/17/2019 Elsevier Patient Education  [place].

## 2022-08-20 NOTE — Assessment & Plan Note (Signed)
Repeat hgbA1c 

## 2022-08-20 NOTE — Progress Notes (Signed)
Established Patient Visit  Patient: [name]   DOB: Apr 11, 1961   62 y.o. 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  Prediabetes Repeat hgb A1c  Hyperlipidemia with target LDL less than 100 Repeat lipid panel  Elevated ferritin, hemoglobin, and red blood cell count (HCC) Repeat cbc and ferritin level  GERD (gastroesophageal reflux disease) Controlled symptoms with prevacid. Symptoms worsen without medication. Med refill sent  Allergic rhinitis Minimal improvement with claritin Sent azelastine spray  HTN (hypertension) Bp at goal with lisinopril/hctz BP Readings from Last 3 Encounters:  08/20/22 126/80  02/19/22 116/62  02/12/22 136/74    Maintain med dose Check BMP  Wt Readings from Last 3 Encounters:  08/20/22 203 lb (92.1 kg)  02/19/22 206 lb 3.2 oz (93.5 kg)  02/12/22 206 lb 9.6 oz (93.7 kg)    Reviewed medical, surgical, and social history today  Medications: Outpatient Medications Prior to Visit  Medication Sig   fenofibrate (TRICOR) 145 MG tablet TAKE 1 TABLET BY MOUTH EVERY DAY   Loratadine (CLARITIN PO) Take 10 mg by mouth daily.   [DISCONTINUED] lansoprazole (PREVACID) 30 MG capsule TAKE 1 CAPSULE BY MOUTH DAILY AT 12 NOON.   [DISCONTINUED] lisinopril-hydrochlorothiazide (ZESTORETIC) 20-12.5 MG tablet Take 1 tablet by mouth daily.    [DISCONTINUED] ALPRAZolam (XANAX) 0.25 MG tablet TAKE 1 TABLET BY MOUTH EVERY DAY AS NEEDED (Patient not taking: Reported on 08/20/2022)   [DISCONTINUED] nitrofurantoin, macrocrystal-monohydrate, (MACROBID) 100 MG capsule Take 1 capsule (100 mg total) by mouth 2 (two) times daily. (Patient not taking: Reported on 02/19/2022)   [DISCONTINUED] phenazopyridine (PYRIDIUM) 200 MG tablet Take 1 tablet (200 mg total) by mouth 3 (three) times daily as needed for pain. (Patient not taking: Reported on 08/20/2022)   No facility-administered medications prior to visit.   Reviewed past medical and social history.   ROS per HPI above      Objective:  BP 126/80 (BP Location: Right Arm, Patient Position: Sitting, Cuff Size: Normal)   Pulse 86   Temp (!) 97.4 F (36.3 C) (Temporal)   Ht '5\' 3"'$  (1.6 m)   Wt 203 lb (92.1 kg)   SpO2 92%   BMI 35.96 kg/m      Physical Exam Constitutional:      Appearance: She is obese.  Cardiovascular:     Rate and Rhythm: Normal rate and regular rhythm.     Pulses: Normal pulses.     Heart sounds: Normal heart sounds.  Pulmonary:     Effort: Pulmonary effort is normal.     Breath sounds: Normal breath sounds.  Musculoskeletal:     Cervical back: Normal range of motion and neck supple.     Right lower leg: No edema.     Left lower leg: No edema.  Neurological:  Mental Status: She is alert and oriented to person, place, and time.     No results found for any visits on 08/20/22.    Assessment & Plan:    Problem List Items Addressed This Visit       Cardiovascular and Mediastinum   HTN (hypertension) - Primary    Bp at goal with lisinopril/hctz BP Readings from Last 3 Encounters:  08/20/22 126/80  02/19/22 116/62  02/12/22 136/74    Maintain med dose Check BMP      Relevant Medications   lisinopril-hydrochlorothiazide (ZESTORETIC) 20-12.5 MG tablet   Other Relevant Orders   Basic metabolic panel     Respiratory   Allergic rhinitis     Minimal improvement with claritin Sent azelastine spray      Relevant Medications   azelastine (ASTELIN) 0.1 % nasal spray     Digestive   GERD (gastroesophageal reflux disease)    Controlled symptoms with prevacid. Symptoms worsen without medication. 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      Other Visit Diagnoses     Essential hypertension       Relevant Medications   lisinopril-hydrochlorothiazide (ZESTORETIC) 20-12.5 MG tablet      Return in about 6 months (around 02/18/2023) for HTN, hyperglycemia, hyperlipidemia (fasting).     ChWilfred LacyNP

## 2022-08-20 NOTE — Assessment & Plan Note (Signed)
Stable mood Use of alprazolam 1-2x/week prn Refill sent

## 2022-08-22 ENCOUNTER — Other Ambulatory Visit: Payer: Self-pay | Admitting: Nurse Practitioner

## 2022-08-22 DIAGNOSIS — Z1231 Encounter for screening mammogram for malignant neoplasm of breast: Secondary | ICD-10-CM

## 2022-09-01 LAB — HM DIABETES EYE EXAM

## 2022-09-02 ENCOUNTER — Other Ambulatory Visit: Payer: Self-pay | Admitting: Physician Assistant

## 2022-09-03 ENCOUNTER — Other Ambulatory Visit: Payer: Self-pay

## 2022-09-03 ENCOUNTER — Inpatient Hospital Stay: Payer: 59

## 2022-09-03 ENCOUNTER — Inpatient Hospital Stay: Payer: 59 | Attending: Physician Assistant | Admitting: Physician Assistant

## 2022-09-03 DIAGNOSIS — I10 Essential (primary) hypertension: Secondary | ICD-10-CM | POA: Insufficient documentation

## 2022-09-03 DIAGNOSIS — Z803 Family history of malignant neoplasm of breast: Secondary | ICD-10-CM | POA: Diagnosis not present

## 2022-09-03 DIAGNOSIS — F1020 Alcohol dependence, uncomplicated: Secondary | ICD-10-CM | POA: Insufficient documentation

## 2022-09-03 DIAGNOSIS — F17210 Nicotine dependence, cigarettes, uncomplicated: Secondary | ICD-10-CM | POA: Diagnosis not present

## 2022-09-03 LAB — CMP (CANCER CENTER ONLY)
ALT: 24 U/L (ref 0–44)
AST: 33 U/L (ref 15–41)
Albumin: 3.6 g/dL (ref 3.5–5.0)
Alkaline Phosphatase: 104 U/L (ref 38–126)
Anion gap: 8 (ref 5–15)
BUN: 16 mg/dL (ref 6–20)
CO2: 25 mmol/L (ref 22–32)
Calcium: 9.2 mg/dL (ref 8.9–10.3)
Chloride: 103 mmol/L (ref 98–111)
Creatinine: 0.81 mg/dL (ref 0.44–1.00)
GFR, Estimated: >60 mL/min (ref >60)
Glucose, Bld: 97 mg/dL (ref 70–99)
Potassium: 3.7 mmol/L (ref 3.5–5.1)
Sodium: 136 mmol/L (ref 135–145)
Total Bilirubin: 0.3 mg/dL (ref 0.3–1.2)
Total Protein: 8.4 g/dL — ABNORMAL HIGH (ref 6.5–8.1)

## 2022-09-03 LAB — CBC WITH DIFFERENTIAL (CANCER CENTER ONLY)
Abs Immature Granulocytes: 0.02 10*3/uL (ref 0.00–0.07)
Basophils Absolute: 0.1 10*3/uL (ref 0.0–0.1)
Basophils Relative: 1 %
Eosinophils Absolute: 0.2 10*3/uL (ref 0.0–0.5)
Eosinophils Relative: 2 %
HCT: 40 % (ref 36.0–46.0)
Hemoglobin: 13.4 g/dL (ref 12.0–15.0)
Immature Granulocytes: 0 %
Lymphocytes Relative: 49 %
Lymphs Abs: 4.2 10*3/uL — ABNORMAL HIGH (ref 0.7–4.0)
MCH: 31.2 pg (ref 26.0–34.0)
MCHC: 33.5 g/dL (ref 30.0–36.0)
MCV: 93 fL (ref 80.0–100.0)
Monocytes Absolute: 0.7 10*3/uL (ref 0.1–1.0)
Monocytes Relative: 8 %
Neutro Abs: 3.4 10*3/uL (ref 1.7–7.7)
Neutrophils Relative %: 40 %
Platelet Count: 309 10*3/uL (ref 150–400)
RBC: 4.3 MIL/uL (ref 3.87–5.11)
RDW: 13.3 % (ref 11.5–15.5)
WBC Count: 8.6 10*3/uL (ref 4.0–10.5)
nRBC: 0 % (ref 0.0–0.2)

## 2022-09-03 NOTE — Progress Notes (Signed)
[place] [phone]   [phone]  PROGRESS NOTE  Patient Care Team: Nche, Charlene Brooke, NP as PCP - General (Internal Medicine)   CHIEF COMPLAINTS/PURPOSE OF CONSULTATION:  Hereditary hemochromatosis: H63D heterozygous mutation  HISTORY OF PRESENTING ILLNESS:  [name] 62 y.o. female with history of hypertension and alcohol abuse presents to the hematology clinic for a follow up for hereditary hemochromatosis. She was last seen by Dr. Alen Blew on 03/05/2021. She returns today to establish care with Dr. Lorenso Courier.   On exam today, [name] reports undergoing blood donation periodically, last one was 08/26/2022. She reports that she is tolerating the blood donations. Her energy and appetite are stable. She is able to complete her ADLs on her own. She denies any bowel habits changes including recurrent episodes of diarrhea or constipation. She denies easy bruising or signs of active bleeding. She is still drinking beer, up to 6 beers per week. She denies fevers, chills, sweats, shortness of breath, chest pain or cough. She has no other complaints. Rest of the 10 point ROS is below.   MEDICAL HISTORY:  Past Medical History:  Diagnosis Date   Allergic rhinitis    Elevated LFTs 02/21/2016   10/1 IMO update     Formatting of this note might be different from the original.  10/1 IMO update   Hypertension     SURGICAL HISTORY: Past Surgical History:  Procedure Laterality Date   APPENDECTOMY     EXPLORATORY LAPAROTOMY  1981    SOCIAL HISTORY: Social History   Socioeconomic History   Marital status: Single    Spouse name: Not on file   Number of children: Not on file   Years of education: Not on file   Highest education level: Not on file  Occupational History   Not on file  Tobacco Use   Smoking status: Every Day    Packs/day: 0.50    Years: 15.00    Total pack years: 7.50    Types: Cigarettes   Smokeless tobacco: Current   Substance and Sexual Activity   Alcohol use: Yes    Alcohol/week: 4.0 standard drinks of alcohol    Types: 4 Cans of beer per week   Drug use: No   Sexual activity: Not Currently    Birth control/protection: Abstinence  Other Topics Concern   Not on file  Social History Narrative   Not on file   Social Determinants of Health   Financial Resource Strain: Not on file  Food Insecurity: Not on file  Transportation Needs: Not on file  Physical Activity: Not on file  Stress: Not on file  Social Connections: Not on file  Intimate Partner Violence: Not on file    FAMILY HISTORY: Family History  Problem Relation Age of Onset   Colon polyps Mother    Breast cancer Sister 65   Breast cancer Maternal Aunt    Colon cancer Neg Hx    Stomach cancer Neg Hx    Rectal cancer Neg Hx    Esophageal cancer Neg Hx     ALLERGIES:  is allergic to statins.  MEDICATIONS:  Current Outpatient Medications  Medication Sig Dispense Refill   ALPRAZolam (XANAX) 0.25 MG tablet Take 1 tablet (0.25 mg total) by mouth daily as needed. 30 tablet 1   azelastine (ASTELIN) 0.1 % nasal spray Place 1 spray into both nostrils 2 (two) times daily. Use in each nostril as directed 30 mL 5   fenofibrate (TRICOR) 145 MG tablet  TAKE 1 TABLET BY MOUTH EVERY DAY 90 tablet 1   lansoprazole (PREVACID) 30 MG capsule TAKE 1 CAPSULE BY MOUTH DAILY AT 12 NOON. 90 capsule 3   lisinopril-hydrochlorothiazide (ZESTORETIC) 20-12.5 MG tablet Take 1 tablet by mouth daily. 90 tablet 1   Loratadine (CLARITIN PO) Take 10 mg by mouth daily.     No current facility-administered medications for this visit.    REVIEW OF SYSTEMS:   Constitutional: ( - ) fevers, ( - )  chills , ( - ) night sweats Eyes: ( - ) blurriness of vision, ( - ) double vision, ( - ) watery eyes Ears, nose, mouth, throat, and face: ( - ) mucositis, ( - ) sore throat Respiratory: ( - ) cough, ( - ) dyspnea, ( - ) wheezes Cardiovascular: ( - ) palpitation, ( - )  chest discomfort, ( - ) lower extremity swelling Gastrointestinal:  ( - ) nausea, ( - ) heartburn, ( - ) change in bowel habits Skin: ( - ) abnormal skin rashes Lymphatics: ( - ) new lymphadenopathy, ( - ) easy bruising Neurological: ( - ) numbness, ( - ) tingling, ( - ) new weaknesses Behavioral/Psych: ( - ) mood change, ( - ) new changes  All other systems were reviewed with the patient and are negative.  PHYSICAL EXAMINATION: ECOG PERFORMANCE STATUS: 0 - Asymptomatic  Vitals:   09/03/22 1458  BP: 123/77  Pulse: 93  Resp: 16  Temp: 98.1 F (36.7 C)  SpO2: 97%   Filed Weights   09/03/22 1458  Weight: 206 lb 4.8 oz (93.6 kg)    GENERAL: well appearing female in NAD  SKIN: skin color, texture, turgor are normal, no rashes or significant lesions EYES: conjunctiva are pink and non-injected, sclera clear LUNGS: clear to auscultation and percussion with normal breathing effort HEART: regular rate & rhythm and no murmurs and no lower extremity edema Musculoskeletal: no cyanosis of digits and no clubbing  PSYCH: alert & oriented x 3, fluent speech NEURO: no focal motor/sensory deficits  LABORATORY DATA:  I have reviewed the data as listed    Latest Ref Rng & Units 09/03/2022    2:44 PM 08/20/2022    9:32 AM 12/27/2021    9:42 AM  CBC  WBC 4.0 - 10.5 K/uL 8.6  7.1  6.1   Hemoglobin 12.0 - 15.0 g/dL 13.4  15.5  14.5   Hematocrit 36.0 - 46.0 % 40.0  45.8  43.1   Platelets 150 - 400 K/uL 309  274.0  249.0        Latest Ref Rng & Units 08/20/2022    9:32 AM 12/27/2021    9:42 AM 09/13/2021    8:32 AM  CMP  Glucose 70 - 99 mg/dL 116  107    BUN 6 - 23 mg/dL 15  14    Creatinine 0.40 - 1.20 mg/dL 0.80  0.80    Sodium 135 - 145 mEq/L 136  133    Potassium 3.5 - 5.1 mEq/L 4.1  3.9    Chloride 96 - 112 mEq/L 101  102    CO2 19 - 32 mEq/L 27  24    Calcium 8.4 - 10.5 mg/dL 9.6  9.7    Total Protein 6.0 - 8.3 g/dL 8.2  8.2  8.0   Total Bilirubin 0.2 - 1.2 mg/dL 0.6  0.5  0.4    Alkaline Phos 39 - 117 U/L 83  85  100   AST 0 - 37  U/L 28  29  28   $ ALT 0 - 35 U/L 17  20  18     $ RADIOGRAPHIC STUDIES: I have personally reviewed the radiological images as listed and agreed with the findings in the report. No results found.  ASSESSMENT & PLAN [name] is a 62 y.o. female who presents for a follow up for hereditary hemochromatosis.   #Hereditary hemochromatosis:H63D heterozygous mutation --Discussed that heterozygous mutations rarely require phlebotomy, but do have elevated serum iron/ferritin levels.   --Labs from today were reviewed. WBC 8.6, Hgb 13.4, MCV 93.0, Plt 309. Ferritin level is 21. --Do no recommend phlebotomies with heterozygous mutations as ferritin levels are unlikely to reach a level to cause organ damage. Generally, ferritin levels need to reach close to 1000 ng/mL to risk organ damage. --Okay to monitor with PCP. Recommend to return to clinic if ferritin levels are greater than 500 ng/mL  #Alcohol dependence: --Patient has decreased consumption to 6 beers/week. Encouraged to drink in moderation to decrease risk of liver damage.   No orders of the defined types were placed in this encounter.   All questions were answered. The patient knows to call the clinic with any problems, questions or concerns.  I have spent a total of 25 minutes minutes of face-to-face and non-face-to-face time, preparing to see the patient, obtaining and/or reviewing separately obtained history, performing a medically appropriate examination, counseling and educating the patient, documenting clinical information in the electronic health record, and care coordination.   Dede Query, PA-C Department of Hematology/Oncology [place] [facility] Phone: [phone]  Patient was seen with Dr. Lorenso Courier who introduced himself and discussed above recommendations.   I have read the above note and personally examined the patient. I agree  with the assessment and plan as noted above.  Briefly [name] is a 62 year old female with medical history significant for heterozygous H63D hemochromatosis who presents for follow-up visit.  At this time no clear indication for phlebotomies as she is heterozygous and has relatively moderate levels of ferritin.  Additionally patient does drink about 6 beers per week, we encouraged her to cut back on her alcohol consumption, ideally no alcohol.  The patient voiced understanding of our plan moving forward.  There is no need for routine follow-up in our clinic and we recommend PCP re -refer her to our clinic in the event she would develop new or worsening issues with her iron levels.   Ledell Peoples, MD Department of Hematology/Oncology [place] [facility] Phone: [phone] Pager: [phone] Email: [email or web address]

## 2022-09-04 LAB — FERRITIN: Ferritin: 21 ng/mL (ref 11–307)

## 2022-09-05 ENCOUNTER — Ambulatory Visit
Admission: RE | Admit: 2022-09-05 | Discharge: 2022-09-05 | Disposition: A | Discharge status: Home / Self Care | Payer: 59 | Source: Ambulatory Visit | Attending: Nurse Practitioner | Admitting: Nurse Practitioner

## 2022-09-05 ENCOUNTER — Encounter: Payer: Self-pay | Admitting: Nurse Practitioner

## 2022-09-05 DIAGNOSIS — Z1231 Encounter for screening mammogram for malignant neoplasm of breast: Secondary | ICD-10-CM | POA: Diagnosis not present

## 2022-09-09 NOTE — Progress Notes (Signed)
Stable Follow instructions as discussed during office visit.

## 2022-09-17 DIAGNOSIS — Z01419 Encounter for gynecological examination (general) (routine) without abnormal findings: Secondary | ICD-10-CM | POA: Diagnosis not present

## 2022-09-17 DIAGNOSIS — Z6836 Body mass index (BMI) 36.0-36.9, adult: Secondary | ICD-10-CM | POA: Diagnosis not present

## 2022-09-17 DIAGNOSIS — Q510 Agenesis and aplasia of uterus: Secondary | ICD-10-CM | POA: Diagnosis not present

## 2022-09-17 DIAGNOSIS — L900 Lichen sclerosus et atrophicus: Secondary | ICD-10-CM | POA: Diagnosis not present

## 2022-10-20 DIAGNOSIS — L900 Lichen sclerosus et atrophicus: Secondary | ICD-10-CM | POA: Diagnosis not present

## 2022-10-20 DIAGNOSIS — Z13820 Encounter for screening for osteoporosis: Secondary | ICD-10-CM | POA: Diagnosis not present

## 2023-01-26 ENCOUNTER — Other Ambulatory Visit: Payer: Self-pay | Admitting: Nurse Practitioner

## 2023-01-26 DIAGNOSIS — E785 Hyperlipidemia, unspecified: Secondary | ICD-10-CM

## 2023-02-18 ENCOUNTER — Encounter: Payer: Self-pay | Admitting: Nurse Practitioner

## 2023-02-18 ENCOUNTER — Ambulatory Visit: Payer: 59 | Admitting: Nurse Practitioner

## 2023-02-18 VITALS — BP 130/84 | HR 74 | Temp 97.9°F | Wt 205.4 lb

## 2023-02-18 DIAGNOSIS — I10 Essential (primary) hypertension: Secondary | ICD-10-CM

## 2023-02-18 DIAGNOSIS — Z720 Tobacco use: Secondary | ICD-10-CM | POA: Diagnosis not present

## 2023-02-18 DIAGNOSIS — R7303 Prediabetes: Secondary | ICD-10-CM | POA: Diagnosis not present

## 2023-02-18 DIAGNOSIS — F411 Generalized anxiety disorder: Secondary | ICD-10-CM | POA: Diagnosis not present

## 2023-02-18 DIAGNOSIS — Z122 Encounter for screening for malignant neoplasm of respiratory organs: Secondary | ICD-10-CM

## 2023-02-18 DIAGNOSIS — E785 Hyperlipidemia, unspecified: Secondary | ICD-10-CM | POA: Diagnosis not present

## 2023-02-18 LAB — LIPID PANEL
Cholesterol: 187 mg/dL (ref 0–200)
HDL: 37.4 mg/dL — ABNORMAL LOW (ref >39.00)
LDL Cholesterol: 123 mg/dL — ABNORMAL HIGH (ref 0–99)
NonHDL: 149.8
Total CHOL/HDL Ratio: 5
Triglycerides: 133 mg/dL (ref 0.0–149.0)
VLDL: 26.6 mg/dL (ref 0.0–40.0)

## 2023-02-18 LAB — TSH: TSH: 2.72 u[IU]/mL (ref 0.35–5.50)

## 2023-02-18 LAB — HEMOGLOBIN A1C: Hgb A1c MFr Bld: 6.4 % (ref 4.6–6.5)

## 2023-02-18 MED ORDER — ALPRAZOLAM 0.25 MG PO TABS
0.2500 mg | ORAL_TABLET | Freq: Every day | ORAL | 0 refills | Status: DC | PRN
Start: 2023-02-18 — End: 2023-07-20

## 2023-02-18 NOTE — Assessment & Plan Note (Addendum)
1/2ppd to 1ppd x73yrs. Approx. 23-43pack years Continues to smoke cigarettes, but has cut down to 6-8cig daily Denies need for chantix or wellbutrin or nicoderm Encourages to continue cutting down with goal to quit. No cough or SOB She agreed to CT chest low dose.

## 2023-02-18 NOTE — Progress Notes (Signed)
Established Patient Visit  Patient: [name]   DOB: [birth year]   62 y.o. Female  MRN: [id number] Visit Date: 02/18/2023  Subjective:    Chief Complaint  Patient presents with   Medical Management of Chronic Issues    6 month f/u for HTN, hyperglycemia, hyperlipidemia (fasting).   HPI GAD (generalized anxiety disorder) Alprazolam last filled 08/20/2022, refill expired. [facility] refill sent  Tobacco use 1/2ppd to 1ppd x7yrs. Approx. 23-43pack years Continues to smoke cigarettes, but has cut down to 6-8cig daily Denies need for chantix or wellbutrin or nicoderm Encourages to continue cutting down with goal to quit. No cough or SOB She agreed to CT chest low dose.   Hyperlipidemia with target LDL less than 100 Repeat lipid panel Advised about importance of tobacco use cessation and DASH diet. Maintain fenofibrate dose  Multiple joint pain: intermittent, worse with inactivity, no improvement with tylenol, improvement with ibuprofen but it causes constipation, use of copper glove with improvement. Bone density completed by GYN-report requested.  Vaginal irritation: use of estrogen gel per GYN.  Reviewed medical, surgical, and social history today  Medications: Outpatient Medications Prior to Visit  Medication Sig   fenofibrate (TRICOR) 145 MG tablet TAKE 1 TABLET BY MOUTH EVERY DAY   lansoprazole (PREVACID) 30 MG capsule TAKE 1 CAPSULE BY MOUTH DAILY AT 12 NOON.   lisinopril-hydrochlorothiazide (ZESTORETIC) 20-12.5 MG tablet Take 1 tablet by mouth daily.   [DISCONTINUED] ALPRAZolam (XANAX) 0.25 MG tablet Take 1 tablet (0.25 mg total) by mouth daily as needed.   [DISCONTINUED] azelastine (ASTELIN) 0.1 % nasal spray Place 1 spray into both nostrils 2 (two) times daily. Use in each nostril as directed (Patient not taking: Reported on 02/18/2023)   [DISCONTINUED] Loratadine (CLARITIN PO) Take 10 mg by mouth daily. (Patient not taking: Reported on  02/18/2023)   No facility-administered medications prior to visit.   Reviewed past medical and social history.   ROS per HPI above      Objective:  BP 130/84 (BP Location: Right Arm, Patient Position: Sitting, Cuff Size: Large)   Pulse 74   Temp 97.9 F (36.6 C) (Temporal)   Wt 205 lb 6.4 oz (93.2 kg)   SpO2 97%   BMI 36.38 kg/m      Physical Exam Vitals and nursing note reviewed.  Cardiovascular:     Rate and Rhythm: Normal rate and regular rhythm.     Pulses: Normal pulses.     Heart sounds: Normal heart sounds.  Pulmonary:     Effort: Pulmonary effort is normal.     Breath sounds: Normal breath sounds.  Musculoskeletal:     Right lower leg: No edema.     Left lower leg: No edema.  Neurological:     Mental Status: She is alert and oriented to person, place, and time.     No results found for any visits on 02/18/23.    Assessment & Plan:    Problem List Items Addressed This Visit       Cardiovascular and Mediastinum   HTN (hypertension) - Primary     Other   GAD (generalized anxiety disorder)    Alprazolam last filled 08/20/2022, refill expired. [facility] refill sent      Relevant Medications   ALPRAZolam (XANAX) 0.25 MG tablet   Hyperlipidemia with target LDL less than 100    Repeat lipid panel Advised about importance of tobacco  use cessation and DASH diet. Maintain fenofibrate dose      Relevant Orders   Lipid panel   TSH   Prediabetes   Relevant Orders   Hemoglobin A1c   Tobacco use    1/2ppd to 1ppd x42yrs. Approx. 23-43pack years Continues to smoke cigarettes, but has cut down to 6-8cig daily Denies need for chantix or wellbutrin or nicoderm Encourages to continue cutting down with goal to quit. No cough or SOB She agreed to CT chest low dose.       Relevant Orders   CT CHEST LUNG CA SCREEN LOW DOSE W/O CM   Other Visit Diagnoses     Encounter for screening for lung cancer       Relevant Orders   CT CHEST LUNG CA  SCREEN LOW DOSE W/O CM     Advised to Start tumeric, tart cherry extract, CoQ10 160mg  every day, calcium 600mg  BID, and Vitamin D3 2000IU daily.This will help with bone health and joint pain  Return in about 6 months (around 08/21/2023) for HTN, hyperlipidemia, prediabetes, CPE (fasting).     Alysia Penna, NP

## 2023-02-18 NOTE — Assessment & Plan Note (Signed)
Alprazolam last filled 08/20/2022, refill expired. [facility] refill sent

## 2023-02-18 NOTE — Assessment & Plan Note (Signed)
Repeat lipid panel Advised about importance of tobacco use cessation and DASH diet. Maintain fenofibrate dose

## 2023-02-18 NOTE — Patient Instructions (Addendum)
Go to lab Continue to work on tobacco cessation Continue Heart healthy diet and daily exercise. Maintain current medications. Sign medical release to get records from GYN-dexa scan and recent notes  Start tumeric, tart cherry extract, CoQ10 160mg  every day, calcium 600mg  BID, and Vitamin D3 2000IU daily. This will help with bone health and joint pain

## 2023-03-02 ENCOUNTER — Other Ambulatory Visit: Payer: Self-pay | Admitting: Nurse Practitioner

## 2023-03-02 DIAGNOSIS — I10 Essential (primary) hypertension: Secondary | ICD-10-CM

## 2023-03-13 ENCOUNTER — Other Ambulatory Visit: Payer: Self-pay | Admitting: Nurse Practitioner

## 2023-03-13 DIAGNOSIS — K219 Gastro-esophageal reflux disease without esophagitis: Secondary | ICD-10-CM

## 2023-06-24 ENCOUNTER — Other Ambulatory Visit: Payer: Self-pay | Admitting: Nurse Practitioner

## 2023-06-24 DIAGNOSIS — E785 Hyperlipidemia, unspecified: Secondary | ICD-10-CM

## 2023-06-28 ENCOUNTER — Ambulatory Visit
Admission: EM | Admit: 2023-06-28 | Discharge: 2023-06-28 | Disposition: A | Discharge status: Home / Self Care | Payer: 59

## 2023-06-28 DIAGNOSIS — J069 Acute upper respiratory infection, unspecified: Secondary | ICD-10-CM | POA: Diagnosis not present

## 2023-06-28 MED ORDER — PROMETHAZINE-DM 6.25-15 MG/5ML PO SYRP: 5.0000 mL | ORAL_SOLUTION | Freq: Four times a day (QID) | ORAL | 0 refills | Status: DC | PRN

## 2023-06-28 MED ORDER — BENZONATATE 100 MG PO CAPS: 100.0000 mg | ORAL_CAPSULE | Freq: Three times a day (TID) | ORAL | 0 refills | Status: DC | PRN

## 2023-06-28 NOTE — ED Provider Notes (Signed)
[facility]    CSN: [id number] Arrival date & time: 06/28/23  1059     History   Chief Complaint Chief Complaint  Patient presents with   Cough    Cough, nasal congestion,and chest congestion x3 days    HPI [name] is a 62 y.o. female.  2-3 day history of nasal congestion and cough Cough somewhat productive  Not having shortness of breath or wheezing. Feels a "rattle" No fever or chills Has tried mucinex, coricidin  Niece was sick over the holiday with runny nose  Past Medical History:  Diagnosis Date   Allergic rhinitis    Elevated LFTs 02/21/2016   10/1 IMO update     Formatting of this note might be different from the original.  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      Home Medications    Prior to Admission medications   Medication Sig Start Date End Date Taking? Authorizing Provider  ALPRAZolam (XANAX) 0.25 MG tablet Take 1 tablet (0.25 mg total) by mouth daily as needed. 02/18/23  Yes Nche, Bonna Gains, NP  benzonatate (TESSALON) 100 MG capsule Take 1 capsule (100 mg total) by mouth 3 (three) times daily as needed for cough. 06/28/23  Yes Leotta Weingarten, Lurena Joiner, PA-C  fenofibrate (TRICOR) 145 MG tablet Take 1 tablet (145 mg total) by mouth daily. No additional refill without office  visit 06/24/23  Yes Nche, Bonna Gains, NP  lansoprazole (PREVACID) 30 MG capsule Take 30 mg by mouth daily. 06/09/23  Yes [provider]  lisinopril-hydrochlorothiazide (ZESTORETIC) 20-12.5 MG tablet TAKE 1 TABLET BY MOUTH EVERY DAY 03/02/23  Yes Nche, Bonna Gains, NP  promethazine-dextromethorphan (PROMETHAZINE-DM) 6.25-15 MG/5ML syrup Take 5 mLs by mouth 4 (four) times daily as needed for cough. 06/28/23  Yes Huberta Tompkins, Lurena Joiner, PA-C    Family History Family History  Problem Relation Age of Onset   Colon polyps Mother    Breast cancer Sister 30   Breast cancer Maternal Aunt    Colon cancer Neg Hx    Stomach cancer Neg Hx    Rectal cancer Neg Hx    Esophageal cancer Neg Hx     Social History Social History   Tobacco Use   Smoking status: Every Day    Current packs/day: 0.50    Average packs/day: 0.5 packs/day for 43.9 years (22.0 ttl pk-yrs)    Types: Cigarettes    Start date: 07/29/1979   Smokeless tobacco: Current  Vaping Use   Vaping status: Never Used  Substance Use Topics   Alcohol use: Yes    Alcohol/week: 4.0 standard drinks of alcohol    Types: 4 Cans of beer per week   Drug use: No     Allergies   Statins   Review of Systems Review of Systems  Respiratory:  Positive for cough.    Per HPI  Physical Exam Triage Vital Signs ED Triage Vitals  Encounter Vitals Group     BP 06/28/23 1344 126/71     Systolic BP Percentile --      Diastolic BP Percentile --      Pulse Rate 06/28/23 1344 (!) 106     Resp 06/28/23 1344 17     Temp --      Temp Source 06/28/23 1344 Oral     SpO2 06/28/23 1344 93 %     Weight 06/28/23 1343 203 lb (92.1 kg)     Height 06/28/23 1343 5\' 3"  (1.6 m)     Head Circumference --      Peak Flow --      Pain Score 06/28/23 1343 0     Pain Loc --      Pain Education --      Exclude from Growth Chart --    No data found.  Updated Vital Signs BP 126/71 (BP Location: Left Arm)   Pulse 98   Temp 98.7 F (37.1 C)   Resp  17   Ht 5\' 3"  (1.6 m)   Wt 203 lb (92.1 kg)   SpO2 96%   BMI 35.96 kg/m    Physical Exam Vitals and nursing note reviewed.  Constitutional:      Appearance: She is not ill-appearing.  HENT:     Right Ear: Tympanic membrane and ear canal normal.     Left Ear: Tympanic membrane and ear canal normal.     Nose: Congestion and rhinorrhea present.     Mouth/Throat:     Mouth: Mucous membranes are moist.     Pharynx: Oropharynx is clear. No posterior oropharyngeal erythema.  Eyes:     Conjunctiva/sclera: Conjunctivae normal.  Cardiovascular:     Rate and Rhythm: Normal rate and regular rhythm.     Pulses: Normal pulses.     Heart sounds: Normal heart sounds.  Pulmonary:     Effort: Pulmonary effort is normal. No respiratory distress.     Breath sounds: Normal breath sounds. No wheezing or rales.  Musculoskeletal:     Cervical back: Normal range of motion.  Lymphadenopathy:     Cervical: No cervical adenopathy.  Skin:    General: Skin is warm and dry.  Neurological:     Mental Status: She is alert and oriented to person, place, and time.     UC Treatments / Results  Labs (all labs ordered are listed, but only abnormal results are displayed) Labs Reviewed - No data to display  EKG  Radiology No results found.  Procedures Procedures   Medications Ordered in UC Medications - No data to display  Initial Impression / Assessment and Plan / UC Course  I have reviewed the triage vital signs and the nursing notes.  Pertinent labs & imaging results that were available during my care of the patient were reviewed by me and considered in my medical decision making (see chart for details).  Afebrile, well appearing, clear lungs Discussed likely viral etiology, symptomatic care.  Would like her to continue the Mucinex but increase dose to 1200 mg twice daily.  Sent Tessalon and promethazine to the pharmacy, can try both and see which works best for her.  Advised if symptoms  persisting for 10+ days, please return for reevaluation.  Patient is agreeable to plan, no questions at this time  Final Clinical Impressions(s) / UC Diagnoses   Final diagnoses:  Viral URI with cough     Discharge Instructions      I recommend either tessalon or promethazine: The tessalon cough pills can be taken 3x daily.  The promethazine DM cough syrup can be used up to 4 times daily. If this medication makes you drowsy, take only once before bed.  Mucinex - 1200 mg twice daily (take two pills together) Drink lots of fluids!  Continue coricidin   Please return if needed I hope you feel better soon!     ED Prescriptions     Medication Sig Dispense Auth. Provider   benzonatate (TESSALON) 100 MG capsule Take 1 capsule (100 mg total) by mouth 3 (three) times daily as needed for cough. 30 capsule Kell Ferris, PA-C   promethazine-dextromethorphan (PROMETHAZINE-DM) 6.25-15 MG/5ML syrup Take 5 mLs by mouth 4 (four) times daily as needed for cough. 240 mL Rollen Selders, Lurena Joiner, PA-C      PDMP not reviewed this encounter.   Marlow Baars, [place] 06/28/23 1457

## 2023-06-28 NOTE — ED Triage Notes (Signed)
Pt states that she has a cough, nasal congestion, and chest congestion x3 days

## 2023-06-28 NOTE — Discharge Instructions (Signed)
I recommend either tessalon or promethazine: The tessalon cough pills can be taken 3x daily.  The promethazine DM cough syrup can be used up to 4 times daily. If this medication makes you drowsy, take only once before bed.  Mucinex - 1200 mg twice daily (take two pills together) Drink lots of fluids!  Continue coricidin   Please return if needed I hope you feel better soon!

## 2023-06-30 DIAGNOSIS — J00 Acute nasopharyngitis [common cold]: Secondary | ICD-10-CM | POA: Diagnosis not present

## 2023-06-30 DIAGNOSIS — U071 COVID-19: Secondary | ICD-10-CM | POA: Diagnosis not present

## 2023-07-20 ENCOUNTER — Other Ambulatory Visit: Payer: Self-pay | Admitting: Nurse Practitioner

## 2023-07-20 DIAGNOSIS — F411 Generalized anxiety disorder: Secondary | ICD-10-CM

## 2023-07-20 DIAGNOSIS — I10 Essential (primary) hypertension: Secondary | ICD-10-CM

## 2023-07-20 DIAGNOSIS — E785 Hyperlipidemia, unspecified: Secondary | ICD-10-CM

## 2023-08-20 ENCOUNTER — Telehealth: Payer: Self-pay | Admitting: Nurse Practitioner

## 2023-08-20 NOTE — Telephone Encounter (Signed)
Patient has a new Engineer, agricultural she needs a call back to see if it is network I looked and didn't see it in patient would like a call

## 2023-08-26 ENCOUNTER — Encounter: Payer: 59 | Admitting: Nurse Practitioner

## 2023-09-05 ENCOUNTER — Other Ambulatory Visit: Payer: Self-pay | Admitting: Nurse Practitioner

## 2023-09-05 DIAGNOSIS — K219 Gastro-esophageal reflux disease without esophagitis: Secondary | ICD-10-CM

## 2023-09-08 ENCOUNTER — Other Ambulatory Visit: Payer: Self-pay | Admitting: Nurse Practitioner

## 2023-09-08 DIAGNOSIS — K219 Gastro-esophageal reflux disease without esophagitis: Secondary | ICD-10-CM

## 2023-09-30 ENCOUNTER — Encounter: Payer: Self-pay | Admitting: Nurse Practitioner

## 2023-09-30 ENCOUNTER — Ambulatory Visit: Payer: 59 | Admitting: Nurse Practitioner

## 2023-09-30 VITALS — BP 122/76 | HR 90 | Temp 98.1°F | Ht 63.0 in | Wt 211.4 lb

## 2023-09-30 DIAGNOSIS — Z720 Tobacco use: Secondary | ICD-10-CM

## 2023-09-30 DIAGNOSIS — E785 Hyperlipidemia, unspecified: Secondary | ICD-10-CM

## 2023-09-30 DIAGNOSIS — I10 Essential (primary) hypertension: Secondary | ICD-10-CM | POA: Diagnosis not present

## 2023-09-30 DIAGNOSIS — Z0000 Encounter for general adult medical examination without abnormal findings: Secondary | ICD-10-CM | POA: Diagnosis not present

## 2023-09-30 DIAGNOSIS — Z0001 Encounter for general adult medical examination with abnormal findings: Secondary | ICD-10-CM

## 2023-09-30 DIAGNOSIS — Z23 Encounter for immunization: Secondary | ICD-10-CM | POA: Diagnosis not present

## 2023-09-30 DIAGNOSIS — R7303 Prediabetes: Secondary | ICD-10-CM

## 2023-09-30 LAB — COMPREHENSIVE METABOLIC PANEL
ALT: 16 U/L (ref 0–35)
AST: 25 U/L (ref 0–37)
Albumin: 4 g/dL (ref 3.5–5.2)
Alkaline Phosphatase: 89 U/L (ref 39–117)
BUN: 13 mg/dL (ref 6–23)
CO2: 28 meq/L (ref 19–32)
Calcium: 9.8 mg/dL (ref 8.4–10.5)
Chloride: 101 meq/L (ref 96–112)
Creatinine, Ser: 0.8 mg/dL (ref 0.40–1.20)
GFR: 79.74 mL/min (ref >60.00)
Glucose, Bld: 97 mg/dL (ref 70–99)
Potassium: 4 meq/L (ref 3.5–5.1)
Sodium: 136 meq/L (ref 135–145)
Total Bilirubin: 0.5 mg/dL (ref 0.2–1.2)
Total Protein: 8.2 g/dL (ref 6.0–8.3)

## 2023-09-30 LAB — LIPID PANEL
Cholesterol: 173 mg/dL (ref 0–200)
HDL: 40.8 mg/dL (ref >39.00)
LDL Cholesterol: 107 mg/dL — ABNORMAL HIGH (ref 0–99)
NonHDL: 131.72
Total CHOL/HDL Ratio: 4
Triglycerides: 126 mg/dL (ref 0.0–149.0)
VLDL: 25.2 mg/dL (ref 0.0–40.0)

## 2023-09-30 LAB — HEMOGLOBIN A1C: Hgb A1c MFr Bld: 6.8 % — ABNORMAL HIGH (ref 4.6–6.5)

## 2023-09-30 NOTE — Assessment & Plan Note (Signed)
 Repeat lipid panel Advised about importance of tobacco use cessation and DASH diet. She has decrease ALCOHOL use to 2 beers per week

## 2023-09-30 NOTE — Assessment & Plan Note (Signed)
 Bp at goal with lisinopril/hctz BP Readings from Last 3 Encounters:  09/30/23 122/76  06/28/23 126/71  02/18/23 130/84    Maintain med dose Check CMP

## 2023-09-30 NOTE — Progress Notes (Signed)
 Complete physical exam  Patient: [name]   DOB: 13-Jan-1961   63 y.o. Female  MRN: [id number] Visit Date: 09/30/2023  Subjective:    Chief Complaint  Patient presents with   Annual Exam   [name] is a 63 y.o. female who presents today for a complete physical exam. She reports consuming a general diet.  No exercise  She generally feels well. She reports sleeping well. She does have additional problems to discuss today.  Vision:Yes Dental:No STD Screen:No  BP Readings from Last 3 Encounters:  09/30/23 122/76  06/28/23 126/71  02/18/23 130/84   Wt Readings from Last 3 Encounters:  09/30/23 211 lb 6.4 oz (95.9 kg)  06/28/23 203 lb (92.1 kg)  02/18/23 205 lb 6.4 oz (93.2 kg)    Most recent fall risk assessment:    09/30/2023    1:01 PM  Fall Risk   Falls in the past year? 0  Number falls in past yr: 0  Injury with Fall? 0  Risk for fall due to : No Fall Risks  Follow up Falls prevention discussed;Falls evaluation completed   Depression screen:Yes - No Depression  Most recent depression screenings:    09/30/2023    1:01 PM 02/18/2023    8:54 AM  PHQ 2/9 Scores  PHQ - 2 Score 0 0  PHQ- 9 Score 1 1    HPI  Tobacco use Continuous daily tobacco use 1/4ppd Not ready to quit. Denies use of chantix or nicotine patch/gum or wellbutrin. She agreed to schedule CT chest for lung cancer screen.  Prediabetes Repeat hgbA1c  Hyperlipidemia with target LDL less than 100 Repeat lipid panel Advised about importance of tobacco use cessation and DASH diet. She has decrease ALCOHOL use to 2 beers per week  HTN (hypertension) Bp at goal with lisinopril/hctz BP Readings from Last 3 Encounters:  09/30/23 122/76  06/28/23 126/71  02/18/23 130/84    Maintain med dose Check CMP   Past Medical History:  Diagnosis Date   Allergic rhinitis    Elevated LFTs 02/21/2016   10/1 IMO update     Formatting of this note might be different from the original.  10/1  IMO update   Hypertension    Past Surgical History:  Procedure Laterality Date   APPENDECTOMY     EXPLORATORY LAPAROTOMY  1981   Social History   Socioeconomic History   Marital status: Single    Spouse name: Not on file   Number of children: 0   Years of education: Not on file   Highest education level: Not on file  Occupational History   Not on file  Tobacco Use   Smoking status: Every Day    Current packs/day: 0.25    Average packs/day: 0.5 packs/day for 44.2 years (22.0 ttl pk-yrs)    Types: Cigarettes    Start date: 07/29/1979   Smokeless tobacco: Current  Vaping Use   Vaping status: Never Used  Substance and Sexual Activity   Alcohol use: Yes    Alcohol/week: 2.0 standard drinks of alcohol    Types: 2 Cans of beer per week   Drug use: No   Sexual activity: Not Currently    Birth control/protection: Abstinence  Other Topics Concern   Not on file  Social History Narrative   Absence of Uterus and fallopian tube-congenital   Ovaries present   Social Drivers of Corporate investment banker Strain: Not on file  Food Insecurity: Not on file  Transportation  Needs: Not on file  Physical Activity: Not on file  Stress: Not on file  Social Connections: Not on file  Intimate Partner Violence: Not on file   Family Status  Relation Name Status   Mother  Alive   Father  Alive   Sister  Alive   Brother 2 Alive   Mat Aunt  (Not Specified)   Neg Hx  (Not Specified)  No partnership data on file   Family History  Problem Relation Age of Onset   Hypertension Mother    Hyperlipidemia Mother    Diabetes Mother    Colon polyps Mother    Hyperlipidemia Sister    Breast cancer Sister 40   Breast cancer Maternal Aunt    Colon cancer Neg Hx    Stomach cancer Neg Hx    Rectal cancer Neg Hx    Esophageal cancer Neg Hx    Allergies  Allergen Reactions   Statins Other (See Comments)    Leg pain and weakness    Patient Care Team: Geovana Gebel, Bonna Gains, NP as PCP -  General (Internal Medicine)   Medications: Outpatient Medications Prior to Visit  Medication Sig   ALPRAZolam (XANAX) 0.25 MG tablet TAKE 1 TABLET BY MOUTH DAILY AS NEEDED.   fenofibrate (TRICOR) 145 MG tablet TAKE 1 TABLET (145 MG TOTAL) BY MOUTH DAILY. NO ADDITIONAL REFILL WITHOUT OFFICE VISIT   lansoprazole (PREVACID) 30 MG capsule Take 1 capsule (30 mg total) by mouth daily at 12 noon. No additional refill without appointment with pcp   lisinopril-hydrochlorothiazide (ZESTORETIC) 20-12.5 MG tablet TAKE 1 TABLET BY MOUTH EVERY DAY   [DISCONTINUED] benzonatate (TESSALON) 100 MG capsule Take 1 capsule (100 mg total) by mouth 3 (three) times daily as needed for cough. (Patient not taking: Reported on 09/30/2023)   [DISCONTINUED] promethazine-dextromethorphan (PROMETHAZINE-DM) 6.25-15 MG/5ML syrup Take 5 mLs by mouth 4 (four) times daily as needed for cough. (Patient not taking: Reported on 09/30/2023)   No facility-administered medications prior to visit.    Review of Systems  Constitutional:  Negative for activity change, appetite change and unexpected weight change.  Respiratory: Negative.    Cardiovascular: Negative.   Gastrointestinal: Negative.   Endocrine: Negative for cold intolerance and heat intolerance.  Genitourinary: Negative.   Musculoskeletal: Negative.   Skin: Negative.   Neurological: Negative.   Hematological: Negative.   Psychiatric/Behavioral:  Negative for behavioral problems, decreased concentration, dysphoric mood, hallucinations, self-injury, sleep disturbance and suicidal ideas. The patient is not nervous/anxious.         Objective:  BP 122/76 (BP Location: Left Arm, Patient Position: Sitting)   Pulse 90   Temp 98.1 F (36.7 C) (Temporal)   Ht 5\' 3"  (1.6 m)   Wt 211 lb 6.4 oz (95.9 kg)   SpO2 98%   BMI 37.45 kg/m     Physical Exam Vitals and nursing note reviewed.  Constitutional:      General: She is not in acute distress. HENT:     Right Ear:  Tympanic membrane, ear canal and external ear normal.     Left Ear: Tympanic membrane, ear canal and external ear normal.     Nose: Nose normal.  Eyes:     Extraocular Movements: Extraocular movements intact.     Conjunctiva/sclera: Conjunctivae normal.     Pupils: Pupils are equal, round, and reactive to light.     Comments: Xanthelasma on bilateral upper eyelids  Neck:     Thyroid: No thyroid mass, thyromegaly or thyroid tenderness.  Cardiovascular:     Rate and Rhythm: Normal rate and regular rhythm.     Pulses: Normal pulses.     Heart sounds: Normal heart sounds.  Pulmonary:     Effort: Pulmonary effort is normal.     Breath sounds: Normal breath sounds.  Abdominal:     General: Bowel sounds are normal.     Palpations: Abdomen is soft.  Musculoskeletal:        General: Normal range of motion.     Cervical back: Normal range of motion and neck supple.     Right lower leg: No edema.     Left lower leg: No edema.  Lymphadenopathy:     Cervical: No cervical adenopathy.  Skin:    General: Skin is warm and dry.  Neurological:     Mental Status: She is alert and oriented to person, place, and time.     Cranial Nerves: No cranial nerve deficit.  Psychiatric:        Mood and Affect: Mood normal.        Behavior: Behavior normal.        Thought Content: Thought content normal.     No results found for any visits on 09/30/23.    Assessment & Plan:    Routine Health Maintenance and Physical Exam  Immunization History  Administered Date(s) Administered   Fluad Trivalent(High Dose 65+) 09/30/2023   Influenza Inj Mdck Quad Pf 08/22/2022   Influenza,inj,Quad PF,6+ Mos 06/28/2021   Influenza-Unspecified 08/22/2022   Moderna Sars-Covid-2 Vaccination 03/16/2020, 05/18/2020   Tdap 01/17/2008, 12/27/2021   Unspecified SARS-COV-2 Vaccination 03/16/2020, 05/18/2020   Varicella 09/20/1961   Zoster Recombinant(Shingrix) 09/30/2023   Health Maintenance  Topic Date Due    Pneumococcal Vaccine 15-41 Years old (1 of 2 - PCV) Never done   Lung Cancer Screening  09/19/2012   Cervical Cancer Screening (HPV/Pap Cotest)  09/29/2024 (Originally 09/19/1992)   HIV Screening  09/29/2024 (Originally 09/19/1977)   Zoster Vaccines- Shingrix (2 of 2) 11/25/2023   MAMMOGRAM  09/05/2024   Colonoscopy  01/04/2030   DTaP/Tdap/Td (3 - Td or Tdap) 12/28/2031   INFLUENZA VACCINE  Completed   Hepatitis C Screening  Completed   HPV VACCINES  Aged Out   COVID-19 Vaccine  Discontinued   Discussed health benefits of physical activity, and encouraged her to engage in regular exercise appropriate for her age and condition.  Problem List Items Addressed This Visit     HTN (hypertension)   Bp at goal with lisinopril/hctz BP Readings from Last 3 Encounters:  09/30/23 122/76  06/28/23 126/71  02/18/23 130/84    Maintain med dose Check CMP      Hyperlipidemia with target LDL less than 100   Repeat lipid panel Advised about importance of tobacco use cessation and DASH diet. She has decrease ALCOHOL use to 2 beers per week      Relevant Orders   Lipid panel   Prediabetes   Repeat hgbA1c      Relevant Orders   Hemoglobin A1c   Tobacco use   Continuous daily tobacco use 1/4ppd Not ready to quit. Denies use of chantix or nicotine patch/gum or wellbutrin. She agreed to schedule CT chest for lung cancer screen.      Relevant Orders   CT CHEST LUNG CANCER SCREENING LOW DOSE WO CONTRAST   Other Visit Diagnoses       Encounter for preventative adult health care exam with abnormal findings    -  Primary  Relevant Orders   Comprehensive metabolic panel     Immunization due       Relevant Orders   Flu Vaccine Trivalent High Dose (Fluad) (Completed)   Zoster Recombinant (Shingrix ) (Completed)      Return in about 3 months (around 12/31/2023) for Weight management and prediabetes (administer prevnar20 and 2nd shingrix vaccine).     Alysia Penna, NP

## 2023-09-30 NOTE — Patient Instructions (Addendum)
 Will get 2nd shingrix vaccine and prevnar vaccine in 3months Go to lab Maintain Heart healthy diet and daily exercise. Maintain current medications.  How to Increase Your Level of Physical Activity Getting regular physical activity is important for your overall health and well-being. Most people do not get enough exercise. There are easy ways to increase your level of physical activity, even if you have not been very active in the past or if you are just starting out. What are the benefits of physical activity? Physical activity has many short-term and long-term benefits. Being active on a regular basis can improve your physical and mental health as well as provide other benefits. Physical health benefits Helping you lose weight or maintain a healthy weight. Strengthening your muscles and bones. Reducing your risk of certain long-term (chronic) diseases, including heart disease, cancer, and diabetes. Being able to move around more easily and for longer periods of time without getting tired (increased endurance or stamina). Improving your ability to fight off illness (enhanced immunity). Being able to sleep better. Helping you stay healthy as you get older, including: Helping you stay mobile, or capable of walking and moving around. Preventing accidents, such as falls. Increasing life expectancy. Mental health benefits Boosting your mood and improving your self-esteem. Lowering your chance of having mental health problems, such as depression or anxiety. Helping you feel good about your body. Other benefits Finding new sources of fun and enjoyment. Meeting new people who share a common interest. Before you begin If you have a chronic illness or have not been active for a while, check with your health care provider about how to get started. Ask your health care provider what activities are safe for you. Start out slowly. Walking or doing some simple chair exercises is a good place to start,  especially if you have not been active before or for a long time. Set goals that you can work toward. Ask your health care provider how much exercise is best for you. In general, most adults should: Do moderate-intensity exercise for at least 150 minutes each week (30 minutes on most days of the week) or vigorous exercise for at least 75 minutes each week, or a combination of these. Moderate-intensity exercise can include walking at a quick pace, biking, yoga, water aerobics, or gardening. Vigorous exercise involves activities that take more effort, such as jogging or running, playing sports, swimming laps, or jumping rope. Do strength exercises on at least 2 days each week. This can include weight lifting, body weight exercises, and resistance-band exercises. How to be more physically active Make a plan  Try to find activities that you enjoy. You are more likely to commit to an exercise routine if it does not feel like a chore. If you have bone or joint problems, choose low-impact exercises, like walking or swimming. Use these tips for being successful with an exercise plan: Find a workout partner for accountability. Join a group or class, such as an aerobics class, cycling class, or sports team. Make family time active. Go for a walk, bike, or swim. Include a variety of exercises each week. Consider using a fitness tracker, such as a mobile phone app or a device worn like a watch, that will count the number of steps you take each day. Many people strive to reach 10,000 steps a day. Find ways to be active in your daily routines Besides your formal exercise plans, you can find ways to do physical activity during your daily routines, such as:  Walking or biking to work or to the store. Taking the stairs instead of the elevator. Parking farther away from the door at work or at the store. Planning walking meetings. Walking around while you are on the phone. Where to find more information Centers  for Disease Control and Prevention: CampusCasting.com.pt President's Council on Fitness, Sports & Nutrition: www.fitness.gov ChooseMyPlate: http://www.harvey.com/ Contact a health care provider if: You have headaches, muscle aches, or joint pain that is concerning. You feel dizzy or light-headed while exercising. You faint. You feel your heart skipping, racing, or fluttering. You have chest pain while exercising. Summary Exercise benefits your mind and body at any age, even if you are just starting out. If you have a chronic illness or have not been active for a while, check with your health care provider before increasing your physical activity. Choose activities that are safe and enjoyable for you. Ask your health care provider what activities are safe for you. Start slowly. Tell your health care provider if you have problems as you start to increase your activity level. This information is not intended to replace advice given to you by your health care provider. Make sure you discuss any questions you have with your health care provider. Document Revised: 11/09/2020 Document Reviewed: 11/09/2020 Elsevier Patient Education  2024 [employer].

## 2023-09-30 NOTE — Assessment & Plan Note (Signed)
 Repeat hgbA1c

## 2023-09-30 NOTE — Assessment & Plan Note (Signed)
 Continuous daily tobacco use 1/4ppd Not ready to quit. Denies use of chantix or nicotine patch/gum or wellbutrin. She agreed to schedule CT chest for lung cancer screen.

## 2023-10-01 ENCOUNTER — Encounter: Payer: Self-pay | Admitting: Nurse Practitioner

## 2023-10-02 ENCOUNTER — Telehealth: Payer: Self-pay

## 2023-10-02 NOTE — Telephone Encounter (Signed)
 Please advise, see below.  Can you assist with this ?

## 2023-10-02 NOTE — Telephone Encounter (Signed)
 Copied from CRM [id number]. Topic: General - Other >> Oct 02, 2023 10:47 AM Elizebeth Brooking wrote: Reason for CRM: [facility] called in regarding the prior auth for the imaging stated they Would need it by Monday 10 am or the patient appointment will be canceled   [phone]

## 2023-10-06 ENCOUNTER — Ambulatory Visit
Admission: RE | Admit: 2023-10-06 | Discharge: 2023-10-06 | Disposition: A | Discharge status: Home / Self Care | Source: Ambulatory Visit | Attending: Nurse Practitioner | Admitting: Nurse Practitioner

## 2023-10-06 ENCOUNTER — Other Ambulatory Visit: Payer: Self-pay | Admitting: Nurse Practitioner

## 2023-10-06 DIAGNOSIS — Z720 Tobacco use: Secondary | ICD-10-CM

## 2023-10-06 DIAGNOSIS — K219 Gastro-esophageal reflux disease without esophagitis: Secondary | ICD-10-CM

## 2023-10-08 ENCOUNTER — Other Ambulatory Visit: Payer: Self-pay | Admitting: Nurse Practitioner

## 2023-10-08 DIAGNOSIS — K219 Gastro-esophageal reflux disease without esophagitis: Secondary | ICD-10-CM

## 2023-10-12 NOTE — Telephone Encounter (Signed)
 Called patient's pharmacy and spoke with [name]. I informed him reason for my call. He informed me that patient has new insurance and the [facility] he guesses is calling and informing the insurance company that patient has tried other medications like Omeprazole, Prilosec medications of that nature that will be cheaper for the patient. I thanked him for taking my calling and stated that I will a call back if I have any other questions.

## 2023-10-13 MED ORDER — OMEPRAZOLE 40 MG PO CPDR: 40.0000 mg | DELAYED_RELEASE_CAPSULE | Freq: Every day | ORAL | 1 refills | Status: DC

## 2023-11-05 ENCOUNTER — Other Ambulatory Visit: Payer: Self-pay | Admitting: Nurse Practitioner

## 2023-11-05 ENCOUNTER — Encounter: Payer: Self-pay | Admitting: Nurse Practitioner

## 2023-11-05 DIAGNOSIS — J439 Emphysema, unspecified: Secondary | ICD-10-CM

## 2023-11-27 ENCOUNTER — Other Ambulatory Visit: Payer: Self-pay | Admitting: Nurse Practitioner

## 2023-11-27 DIAGNOSIS — E785 Hyperlipidemia, unspecified: Secondary | ICD-10-CM

## 2023-12-01 NOTE — Telephone Encounter (Signed)
 Medication: Fenofibrate  (Tricor ) 145 mg  Directions: Take 1 tablet by mouth daily  Last given: 07/20/23 Number refills:1  Last o/v: 09/30/23 Follow up: 3 months-Scheduled for 12/31/23 Labs: 09/30/23

## 2023-12-21 ENCOUNTER — Other Ambulatory Visit: Payer: Self-pay | Admitting: Nurse Practitioner

## 2023-12-21 ENCOUNTER — Other Ambulatory Visit: Payer: Self-pay | Admitting: Internal Medicine

## 2023-12-21 DIAGNOSIS — I10 Essential (primary) hypertension: Secondary | ICD-10-CM

## 2023-12-21 DIAGNOSIS — F411 Generalized anxiety disorder: Secondary | ICD-10-CM

## 2023-12-31 ENCOUNTER — Ambulatory Visit: Admitting: Nurse Practitioner

## 2023-12-31 ENCOUNTER — Encounter: Payer: Self-pay | Admitting: Nurse Practitioner

## 2023-12-31 VITALS — BP 118/68 | HR 88 | Temp 97.4°F | Ht 63.0 in | Wt 205.6 lb

## 2023-12-31 DIAGNOSIS — R7303 Prediabetes: Secondary | ICD-10-CM | POA: Diagnosis not present

## 2023-12-31 DIAGNOSIS — Z23 Encounter for immunization: Secondary | ICD-10-CM | POA: Diagnosis not present

## 2023-12-31 LAB — POCT GLYCOSYLATED HEMOGLOBIN (HGB A1C)
HbA1c POC (<> result, manual entry): 6.3 % (ref 4.0–5.6)
Hemoglobin A1C: 6.3 % — AB (ref 4.0–5.6)

## 2023-12-31 NOTE — Assessment & Plan Note (Signed)
 Repeat hgbA1c: 6.9 to 6.3% improved She has made diet modifications and started daily exercise.  Encouraged to maintain lifestyle modification F/up in 72month

## 2023-12-31 NOTE — Patient Instructions (Addendum)
 Improved hgbA1c: 6.9 to 6.3% Keep up the good work with diet and exercise  Diabetes Mellitus and Nutrition, Adult When you have diabetes, or diabetes mellitus, it is very important to have healthy eating habits because your blood sugar (glucose) levels are greatly affected by what you eat and drink. Eating healthy foods in the right amounts, at about the same times every day, can help you: Manage your blood glucose. Lower your risk of heart disease. Improve your blood pressure. Reach or maintain a healthy weight. What can affect my meal plan? Every person with diabetes is different, and each person has different needs for a meal plan. Your health care provider may recommend that you work with a dietitian to make a meal plan that is best for you. Your meal plan may vary depending on factors such as: The calories you need. The medicines you take. Your weight. Your blood glucose, blood pressure, and cholesterol levels. Your activity level. Other health conditions you have, such as heart or kidney disease. How do carbohydrates affect me? Carbohydrates, also called carbs, affect your blood glucose level more than any other type of food. Eating carbs raises the amount of glucose in your blood. It is important to know how many carbs you can safely have in each meal. This is different for every person. Your dietitian can help you calculate how many carbs you should have at each meal and for each snack. How does alcohol affect me? Alcohol can cause a decrease in blood glucose (hypoglycemia), especially if you use insulin or take certain diabetes medicines by mouth. Hypoglycemia can be a life-threatening condition. Symptoms of hypoglycemia, such as sleepiness, dizziness, and confusion, are similar to symptoms of having too much alcohol. Do not drink alcohol if: Your health care provider tells you not to drink. You are pregnant, may be pregnant, or are planning to become pregnant. If you drink  alcohol: Limit how much you have to: 0-1 drink a day for women. 0-2 drinks a day for men. Know how much alcohol is in your drink. In the [place] one drink equals one 12 oz bottle of beer (355 mL), one 5 oz glass of wine (148 mL), or one 1 oz glass of hard liquor (44 mL). Keep yourself hydrated with water, diet soda, or unsweetened iced tea. Keep in mind that regular soda, juice, and other mixers may contain a lot of sugar and must be counted as carbs. What are tips for following this plan?  Reading food labels Start by checking the serving size on the Nutrition Facts label of packaged foods and drinks. The number of calories and the amount of carbs, fats, and other nutrients listed on the label are based on one serving of the item. Many items contain more than one serving per package. Check the total grams (g) of carbs in one serving. Check the number of grams of saturated fats and trans fats in one serving. Choose foods that have a low amount or none of these fats. Check the number of milligrams (mg) of salt (sodium) in one serving. Most people should limit total sodium intake to less than 2,300 mg per day. Always check the nutrition information of foods labeled as "low-fat" or "nonfat." These foods may be higher in added sugar or refined carbs and should be avoided. Talk to your dietitian to identify your daily goals for nutrients listed on the label. Shopping Avoid buying canned, pre-made, or processed foods. These foods tend to be high in fat, sodium, and  added sugar. Shop around the outside edge of the grocery store. This is where you will most often find fresh fruits and vegetables, bulk grains, fresh meats, and fresh dairy products. Cooking Use low-heat cooking methods, such as baking, instead of high-heat cooking methods, such as deep frying. Cook using healthy oils, such as olive, canola, or sunflower oil. Avoid cooking with butter, cream, or high-fat meats. Meal planning Eat meals and  snacks regularly, preferably at the same times every day. Avoid going long periods of time without eating. Eat foods that are high in fiber, such as fresh fruits, vegetables, beans, and whole grains. Eat 4-6 oz (112-168 g) of lean protein each day, such as lean meat, chicken, fish, eggs, or tofu. One ounce (oz) (28 g) of lean protein is equal to: 1 oz (28 g) of meat, chicken, or fish. 1 egg.  cup (62 g) of tofu. Eat some foods each day that contain healthy fats, such as avocado, nuts, seeds, and fish. What foods should I eat? Fruits Berries. Apples. Oranges. Peaches. Apricots. Plums. Grapes. Mangoes. Papayas. Pomegranates. Kiwi. Cherries. Vegetables Leafy greens, including lettuce, spinach, kale, chard, collard greens, mustard greens, and cabbage. Beets. Cauliflower. Broccoli. Carrots. Green beans. Tomatoes. Peppers. Onions. Cucumbers. Brussels sprouts. Grains Whole grains, such as whole-wheat or whole-grain bread, crackers, tortillas, cereal, and pasta. Unsweetened oatmeal. Quinoa. Brown or wild rice. Meats and other proteins Seafood. Poultry without skin. Lean cuts of poultry and beef. Tofu. Nuts. Seeds. Dairy Low-fat or fat-free dairy products such as milk, yogurt, and cheese. The items listed above may not be a complete list of foods and beverages you can eat and drink. Contact a dietitian for more information. What foods should I avoid? Fruits Fruits canned with syrup. Vegetables Canned vegetables. Frozen vegetables with butter or cream sauce. Grains Refined white flour and flour products such as bread, pasta, snack foods, and cereals. Avoid all processed foods. Meats and other proteins Fatty cuts of meat. Poultry with skin. Breaded or fried meats. Processed meat. Avoid saturated fats. Dairy Full-fat yogurt, cheese, or milk. Beverages Sweetened drinks, such as soda or iced tea. The items listed above may not be a complete list of foods and beverages you should avoid. Contact a  dietitian for more information. Questions to ask a health care provider Do I need to meet with a certified diabetes care and education specialist? Do I need to meet with a dietitian? What number can I call if I have questions? When are the best times to check my blood glucose? Where to find more information: American Diabetes Association: diabetes.org Academy of Nutrition and Dietetics: eatright.[employer] of Diabetes and Digestive and Kidney Diseases: StageSync.si Association of Diabetes Care & Education Specialists: diabeteseducator.org Summary It is important to have healthy eating habits because your blood sugar (glucose) levels are greatly affected by what you eat and drink. It is important to use alcohol carefully. A healthy meal plan will help you manage your blood glucose and lower your risk of heart disease. Your health care provider may recommend that you work with a dietitian to make a meal plan that is best for you. This information is not intended to replace advice given to you by your health care provider. Make sure you discuss any questions you have with your health care provider. Document Revised: 02/14/2020 Document Reviewed: 02/15/2020 Elsevier Patient Education  2024 [employer].

## 2023-12-31 NOTE — Progress Notes (Signed)
                Established Patient Visit  Patient: [name]   DOB: 03-06-1961   63 y.o. Female  MRN: [id number] Visit Date: 12/31/2023  Subjective:     Chief Complaint  Patient presents with   Follow-up    3 month follow up    HPI Prediabetes Repeat hgbA1c: 6.9 to 6.3% improved She has made diet modifications and started daily exercise.  Encouraged to maintain lifestyle modification F/up in 27month  Wt Readings from Last 3 Encounters:  12/31/23 205 lb 9.6 oz (93.3 kg)  09/30/23 211 lb 6.4 oz (95.9 kg)  06/28/23 203 lb (92.1 kg)    BP Readings from Last 3 Encounters:  12/31/23 118/68  09/30/23 122/76  06/28/23 126/71    Reviewed medical, surgical, and social history today  Medications: Outpatient Medications Prior to Visit  Medication Sig   ALPRAZolam  (XANAX ) 0.25 MG tablet TAKE 1 TABLET BY MOUTH EVERY DAY AS NEEDED   fenofibrate  (TRICOR ) 145 MG tablet TAKE 1 TABLET (145 MG TOTAL) BY MOUTH DAILY. NO ADDITIONAL REFILL WITHOUT OFFICE VISIT   lisinopril -hydrochlorothiazide  (ZESTORETIC ) 20-12.5 MG tablet TAKE 1 TABLET BY MOUTH EVERY DAY   omeprazole  (PRILOSEC) 40 MG capsule Take 1 capsule (40 mg total) by mouth daily.   No facility-administered medications prior to visit.   Reviewed past medical and social history.   ROS per HPI above      Objective:  BP 118/68 (BP Location: Left Arm, Patient Position: Sitting, Cuff Size: Large)   Pulse 88   Temp (!) 97.4 F (36.3 C) (Temporal)   Ht 5\' 3"  (1.6 m)   Wt 205 lb 9.6 oz (93.3 kg)   SpO2 97%   BMI 36.42 kg/m      Physical Exam Vitals and nursing note reviewed.  Cardiovascular:     Rate and Rhythm: Normal rate.     Pulses: Normal pulses.  Pulmonary:     Effort: Pulmonary effort is normal.  Neurological:     Mental Status: She is alert and oriented to person, place, and time.     Results for orders placed or performed in visit on 12/31/23  POCT glycosylated hemoglobin (Hb A1C)  Result Value Ref  Range   Hemoglobin A1C 6.3 (A) 4.0 - 5.6 %   HbA1c POC (<> result, manual entry) 6.3 4.0 - 5.6 %   HbA1c, POC (prediabetic range)     HbA1c, POC (controlled diabetic range)        Assessment & Plan:    Problem List Items Addressed This Visit     Prediabetes   Repeat hgbA1c: 6.9 to 6.3% improved She has made diet modifications and started daily exercise.  Encouraged to maintain lifestyle modification F/up in 27month      Relevant Orders   POCT glycosylated hemoglobin (Hb A1C) (Completed)   Other Visit Diagnoses       Need for zoster vaccine    -  Primary   Relevant Orders   Varicella-zoster vaccine IM (Completed)     Need for pneumococcal vaccination       Relevant Orders   Pneumococcal conjugate vaccine 20-valent (Prevnar 20) (Completed)      Return in about 3 months (around 04/01/2024) for HTN, prediabetes, and, hyperlipidemia (fasting).     Kathrene Parents, NP

## 2024-04-04 ENCOUNTER — Ambulatory Visit: Payer: Self-pay | Admitting: Nurse Practitioner

## 2024-04-04 ENCOUNTER — Ambulatory Visit: Admitting: Nurse Practitioner

## 2024-04-04 ENCOUNTER — Encounter: Payer: Self-pay | Admitting: Nurse Practitioner

## 2024-04-04 VITALS — BP 124/66 | HR 84 | Temp 97.8°F | Ht 63.0 in | Wt 202.4 lb

## 2024-04-04 DIAGNOSIS — D582 Other hemoglobinopathies: Secondary | ICD-10-CM

## 2024-04-04 DIAGNOSIS — E1169 Type 2 diabetes mellitus with other specified complication: Secondary | ICD-10-CM

## 2024-04-04 DIAGNOSIS — R7303 Prediabetes: Secondary | ICD-10-CM

## 2024-04-04 DIAGNOSIS — R7989 Other specified abnormal findings of blood chemistry: Secondary | ICD-10-CM | POA: Diagnosis not present

## 2024-04-04 DIAGNOSIS — E785 Hyperlipidemia, unspecified: Secondary | ICD-10-CM

## 2024-04-04 DIAGNOSIS — I10 Essential (primary) hypertension: Secondary | ICD-10-CM | POA: Diagnosis not present

## 2024-04-04 DIAGNOSIS — Z7984 Long term (current) use of oral hypoglycemic drugs: Secondary | ICD-10-CM

## 2024-04-04 DIAGNOSIS — F411 Generalized anxiety disorder: Secondary | ICD-10-CM | POA: Diagnosis not present

## 2024-04-04 LAB — HEMOGLOBIN A1C: Hgb A1c MFr Bld: 6.7 % — ABNORMAL HIGH (ref 4.6–6.5)

## 2024-04-04 LAB — RENAL FUNCTION PANEL
Albumin: 4.1 g/dL (ref 3.5–5.2)
BUN: 17 mg/dL (ref 6–23)
CO2: 25 meq/L (ref 19–32)
Calcium: 10 mg/dL (ref 8.4–10.5)
Chloride: 101 meq/L (ref 96–112)
Creatinine, Ser: 0.87 mg/dL (ref 0.40–1.20)
GFR: 71.85 mL/min (ref >60.00)
Glucose, Bld: 127 mg/dL — ABNORMAL HIGH (ref 70–99)
Phosphorus: 2.9 mg/dL (ref 2.3–4.6)
Potassium: 3.8 meq/L (ref 3.5–5.1)
Sodium: 134 meq/L — ABNORMAL LOW (ref 135–145)

## 2024-04-04 LAB — LIPID PANEL
Cholesterol: 168 mg/dL (ref 0–200)
HDL: 41 mg/dL (ref >39.00)
LDL Cholesterol: 103 mg/dL — ABNORMAL HIGH (ref 0–99)
NonHDL: 126.59
Total CHOL/HDL Ratio: 4
Triglycerides: 119 mg/dL (ref 0.0–149.0)
VLDL: 23.8 mg/dL (ref 0.0–40.0)

## 2024-04-04 NOTE — Assessment & Plan Note (Signed)
Repeat iron panel 

## 2024-04-04 NOTE — Assessment & Plan Note (Addendum)
 Repeat hgbA1c: Increase in hgbA1c: 6.3 to 6.8% this is considered type 2 DIABETES. Start metformin  500mg  daily. Stable renal function F/up in 3months

## 2024-04-04 NOTE — Patient Instructions (Signed)
 Go to lab Maintain Heart healthy diet and daily exercise. Maintain current medications.

## 2024-04-04 NOTE — Assessment & Plan Note (Signed)
 Bp at goal with lisinopril /hctz BP Readings from Last 3 Encounters:  04/04/24 124/66  12/31/23 118/68  09/30/23 122/76    Maintain med dose Check BMP

## 2024-04-04 NOTE — Progress Notes (Signed)
 Established Patient Visit  Patient: [name]   DOB: Dec 20, 1960   63 y.o. Female  MRN: [id number] Visit Date: 04/04/2024  Subjective:    Chief Complaint  Patient presents with   Follow-up    FASTING 3 month follow up for HTN, prediabetes, hyperlipidemia    HPI HTN (hypertension) Bp at goal with lisinopril /hctz BP Readings from Last 3 Encounters:  04/04/24 124/66  12/31/23 118/68  09/30/23 122/76    Maintain med dose Check BMP  DM (diabetes mellitus) (HCC) Repeat hgbA1c: Increase in hgbA1c: 6.3 to 6.8% this is considered type 2 DIABETES. Start metformin  500mg  daily. Stable renal function F/up in 3months  Hyperlipidemia with target LDL less than 100 Unable to tolerate statins Repeat lipid panel: LDL continues to improve but not at goal: stop fenofibrate  since triglyceride has remained normal. Start zetia  to help improve LDL Advised about importance of tobacco use cessation and mediterranean diet. She has maintained ALCOHOL use at 2 beers per week  Elevated ferritin, hemoglobin, and red blood cell count (HCC) Repeat iron panel  GAD (generalized anxiety disorder) Alprazolam  last filled 12/22/2023 Stable mood Denies need for refill at this time  Wt Readings from Last 3 Encounters:  04/04/24 202 lb 6.4 oz (91.8 kg)  12/31/23 205 lb 9.6 oz (93.3 kg)  09/30/23 211 lb 6.4 oz (95.9 kg)    Reviewed medical, surgical, and social history today  Medications: Outpatient Medications Prior to Visit  Medication Sig   ALPRAZolam  (XANAX ) 0.25 MG tablet TAKE 1 TABLET BY MOUTH EVERY DAY AS NEEDED   lisinopril -hydrochlorothiazide  (ZESTORETIC ) 20-12.5 MG tablet TAKE 1 TABLET BY MOUTH EVERY DAY   omeprazole  (PRILOSEC) 40 MG capsule Take 1 capsule (40 mg total) by mouth daily.   [DISCONTINUED] fenofibrate  (TRICOR ) 145 MG tablet TAKE 1 TABLET (145 MG TOTAL) BY MOUTH DAILY. NO ADDITIONAL REFILL WITHOUT OFFICE VISIT   No facility-administered medications prior to  visit.   Reviewed past medical and social history.   ROS per HPI above      Objective:  BP 124/66 (BP Location: Left Arm, Patient Position: Sitting, Cuff Size: Large)   Pulse 84   Temp 97.8 F (36.6 C) (Oral)   Ht 5' 3 (1.6 m)   Wt 202 lb 6.4 oz (91.8 kg)   BMI 35.85 kg/m      Physical Exam Vitals and nursing note reviewed.  Cardiovascular:     Rate and Rhythm: Normal rate and regular rhythm.     Pulses: Normal pulses.     Heart sounds: Normal heart sounds.  Pulmonary:     Effort: Pulmonary effort is normal.     Breath sounds: Normal breath sounds.  Neurological:     Mental Status: She is alert and oriented to person, place, and time.     Results for orders placed or performed in visit on 04/04/24  Hemoglobin A1c  Result Value Ref Range   Hgb A1c MFr Bld 6.7 (H) 4.6 - 6.5 %  Lipid panel  Result Value Ref Range   Cholesterol 168 0 - 200 mg/dL   Triglycerides 880.9 0.0 - 149.0 mg/dL   HDL 58.99 >60.99 mg/dL   VLDL 76.1 0.0 - 59.9 mg/dL   LDL Cholesterol 896 (H) 0 - 99 mg/dL   Total CHOL/HDL Ratio 4    NonHDL 126.59   Renal Function Panel  Result Value Ref Range   Sodium 134 (L) 135 - 145 mEq/L  Potassium 3.8 3.5 - 5.1 mEq/L   Chloride 101 96 - 112 mEq/L   CO2 25 19 - 32 mEq/L   Albumin 4.1 3.5 - 5.2 g/dL   BUN 17 6 - 23 mg/dL   Creatinine, Ser 9.12 0.40 - 1.20 mg/dL   Glucose, Bld 872 (H) 70 - 99 mg/dL   Phosphorus 2.9 2.3 - 4.6 mg/dL   GFR 28.14 >39.99 mL/min   Calcium  10.0 8.4 - 10.5 mg/dL      Assessment & Plan:    Problem List Items Addressed This Visit     DM (diabetes mellitus) (HCC)   Repeat hgbA1c: Increase in hgbA1c: 6.3 to 6.8% this is considered type 2 DIABETES. Start metformin  500mg  daily. Stable renal function F/up in 3months      Elevated ferritin, hemoglobin, and red blood cell count (HCC)   Repeat iron panel      Relevant Orders   Iron, TIBC and Ferritin Panel   GAD (generalized anxiety disorder)   Alprazolam  last filled  12/22/2023 Stable mood Denies need for refill at this time      HTN (hypertension) - Primary   Bp at goal with lisinopril /hctz BP Readings from Last 3 Encounters:  04/04/24 124/66  12/31/23 118/68  09/30/23 122/76    Maintain med dose Check BMP      Relevant Orders   Renal Function Panel (Completed)   Hyperlipidemia with target LDL less than 100   Unable to tolerate statins Repeat lipid panel: LDL continues to improve but not at goal: stop fenofibrate  since triglyceride has remained normal. Start zetia  to help improve LDL Advised about importance of tobacco use cessation and mediterranean diet. She has maintained ALCOHOL use at 2 beers per week      Relevant Orders   Lipid panel (Completed)   Return in about 6 months (around 10/02/2024) for HTN, prediabetes, hyperlipidemia (fasting).     Roselie Mood, NP

## 2024-04-04 NOTE — Assessment & Plan Note (Addendum)
 Unable to tolerate statins Repeat lipid panel: LDL continues to improve but not at goal: stop fenofibrate  since triglyceride has remained normal. Start zetia  to help improve LDL Advised about importance of tobacco use cessation and mediterranean diet. She has maintained ALCOHOL use at 2 beers per week

## 2024-04-04 NOTE — Assessment & Plan Note (Signed)
 Alprazolam  last filled 12/22/2023 Stable mood Denies need for refill at this time

## 2024-04-05 LAB — IRON,TIBC AND FERRITIN PANEL
%SAT: 22 % (ref 16–45)
Ferritin: 28 ng/mL (ref 16–288)
Iron: 96 ug/dL (ref 45–160)
TIBC: 428 ug/dL (ref 250–450)

## 2024-04-09 ENCOUNTER — Other Ambulatory Visit: Payer: Self-pay | Admitting: Nurse Practitioner

## 2024-04-09 DIAGNOSIS — K219 Gastro-esophageal reflux disease without esophagitis: Secondary | ICD-10-CM

## 2024-04-11 MED ORDER — METFORMIN HCL ER 500 MG PO TB24: 500.0000 mg | ORAL_TABLET | Freq: Every day | ORAL | 1 refills | Status: AC

## 2024-04-11 MED ORDER — EZETIMIBE 10 MG PO TABS: 10.0000 mg | ORAL_TABLET | Freq: Every day | ORAL | 3 refills | Status: AC

## 2024-05-28 ENCOUNTER — Other Ambulatory Visit: Payer: Self-pay | Admitting: Nurse Practitioner

## 2024-05-28 DIAGNOSIS — E785 Hyperlipidemia, unspecified: Secondary | ICD-10-CM

## 2024-07-18 ENCOUNTER — Other Ambulatory Visit: Payer: Self-pay

## 2024-07-18 DIAGNOSIS — F411 Generalized anxiety disorder: Secondary | ICD-10-CM

## 2024-07-18 DIAGNOSIS — K219 Gastro-esophageal reflux disease without esophagitis: Secondary | ICD-10-CM

## 2024-07-18 DIAGNOSIS — I10 Essential (primary) hypertension: Secondary | ICD-10-CM

## 2024-07-18 MED ORDER — LISINOPRIL-HYDROCHLOROTHIAZIDE 20-12.5 MG PO TABS: 1.0000 | ORAL_TABLET | Freq: Every day | ORAL | 0 refills | Status: AC

## 2024-07-18 MED ORDER — OMEPRAZOLE 40 MG PO CPDR: 40.0000 mg | DELAYED_RELEASE_CAPSULE | Freq: Every day | ORAL | 0 refills | Status: AC

## 2024-07-18 NOTE — Telephone Encounter (Signed)
 Copied from CRM #[id number]. Topic: Appointments - Appointment Cancel/Reschedule >> Jul 18, 2024 11:09 AM Rosina BIRCH wrote: Patient/patient representative is calling to cancel or reschedule an appointment. Refer to attachments for appointment information.   patient does not want to reschedule due to her not knowing if the provider takes her insurance-ambetter. She has to do research and the insurance company told her [employer]

## 2024-07-18 NOTE — Telephone Encounter (Signed)
 Called patient to make sure I am understanding that she has and/or has canceled her upcoming appointment with Roselie due to her new insurance. She stated that due to her insurance seh will no longer be able to see Roselie because Ali Chuk is not in [place] is. She asked what is she to do about her medications until she gets in with a new provider. I asked if I place her on hold to ask about the matter of her medications.   Spoke with DOD Tinnie Harada, NP about the matter. Per Tinnie it is okay to refill medication for 90 days and as for the Xanax  it will have to go as a 30 day no refills. She also stated to remind patient that if she is not in with her new provider that she will need to make an appointment to get more refills.  Informed patient of DOD comments and when asked about the Xanax  0.25 mg, Zetia  10 mg, Lisinopril -hydrochlorothiazide  20-12.5, Metformin  50 mg and the Omeprazole  40 mg. Patient stated that she is not taking the Zetia  or the Metformin  and that Roselie knows that she is not taking those two medications. Patient was also informed about the Xanax  can only be filled for a month at this time and to call the office once Roselie returns to ask her if she will refill again if not scheduled with new PCP. Patient verbalized understanding and stated to only send Xanax , Omeprazole  and Lisinopril /Hydrochlorothiazide  to the [facility] pharmacy on [address]

## 2024-07-19 MED ORDER — ALPRAZOLAM 0.25 MG PO TABS: 0.2500 mg | ORAL_TABLET | Freq: Every day | ORAL | 0 refills | Status: AC | PRN

## 2024-07-22 ENCOUNTER — Ambulatory Visit: Admitting: Physician Assistant

## 2024-10-03 ENCOUNTER — Ambulatory Visit: Admitting: Nurse Practitioner
